# Patient Record
Sex: Female | Born: 1975 | Hispanic: Yes | Marital: Single | Smoking: Never smoker
Health system: Southern US, Community
[De-identification: ages and names within clinical notes are randomized; demographics above are authoritative.]

## PROBLEM LIST (undated history)

## (undated) DIAGNOSIS — K219 Gastro-esophageal reflux disease without esophagitis: Secondary | ICD-10-CM

## (undated) DIAGNOSIS — G4733 Obstructive sleep apnea (adult) (pediatric): Secondary | ICD-10-CM

## (undated) HISTORY — PX: ABDOMINAL SURGERY: SHX537

---

## 2019-04-21 ENCOUNTER — Emergency Department (HOSPITAL_COMMUNITY)
Admission: EM | Admit: 2019-04-21 | Discharge: 2019-04-21 | Disposition: A | Discharge status: Home / Self Care | Payer: Self-pay | Attending: Emergency Medicine | Admitting: Emergency Medicine

## 2019-04-21 ENCOUNTER — Encounter (HOSPITAL_COMMUNITY): Payer: Self-pay | Admitting: Emergency Medicine

## 2019-04-21 ENCOUNTER — Emergency Department (HOSPITAL_COMMUNITY): Payer: Self-pay

## 2019-04-21 ENCOUNTER — Other Ambulatory Visit: Payer: Self-pay

## 2019-04-21 DIAGNOSIS — R1033 Periumbilical pain: Secondary | ICD-10-CM | POA: Insufficient documentation

## 2019-04-21 LAB — CBC
HCT: 43.4 % (ref 36.0–46.0)
Hemoglobin: 14.2 g/dL (ref 12.0–15.0)
MCH: 31.1 pg (ref 26.0–34.0)
MCHC: 32.7 g/dL (ref 30.0–36.0)
MCV: 95.2 fL (ref 80.0–100.0)
Platelets: 269 10*3/uL (ref 150–400)
RBC: 4.56 MIL/uL (ref 3.87–5.11)
RDW: 12.5 % (ref 11.5–15.5)
WBC: 7.2 10*3/uL (ref 4.0–10.5)
nRBC: 0 % (ref 0.0–0.2)

## 2019-04-21 LAB — HEPATITIS PANEL, ACUTE
HCV Ab: NONREACTIVE
Hep A IgM: NONREACTIVE
Hep B C IgM: NONREACTIVE
Hepatitis B Surface Ag: NONREACTIVE

## 2019-04-21 LAB — ACETAMINOPHEN LEVEL: Acetaminophen (Tylenol), Serum: <10 ug/mL — ABNORMAL LOW (ref 10–30)

## 2019-04-21 LAB — COMPREHENSIVE METABOLIC PANEL
ALT: 201 U/L — ABNORMAL HIGH (ref 0–44)
AST: 65 U/L — ABNORMAL HIGH (ref 15–41)
Albumin: 3.7 g/dL (ref 3.5–5.0)
Alkaline Phosphatase: 253 U/L — ABNORMAL HIGH (ref 38–126)
Anion gap: 9 (ref 5–15)
BUN: 11 mg/dL (ref 6–20)
CO2: 23 mmol/L (ref 22–32)
Calcium: 9 mg/dL (ref 8.9–10.3)
Chloride: 107 mmol/L (ref 98–111)
Creatinine, Ser: 0.59 mg/dL (ref 0.44–1.00)
GFR calc Af Amer: >60 mL/min (ref >60)
GFR calc non Af Amer: >60 mL/min (ref >60)
Glucose, Bld: 94 mg/dL (ref 70–99)
Potassium: 3.9 mmol/L (ref 3.5–5.1)
Sodium: 139 mmol/L (ref 135–145)
Total Bilirubin: 0.6 mg/dL (ref 0.3–1.2)
Total Protein: 7 g/dL (ref 6.5–8.1)

## 2019-04-21 LAB — I-STAT BETA HCG BLOOD, ED (MC, WL, AP ONLY): I-stat hCG, quantitative: <5 m[IU]/mL (ref <5)

## 2019-04-21 LAB — URINALYSIS, ROUTINE W REFLEX MICROSCOPIC
Bilirubin Urine: NEGATIVE
Glucose, UA: NEGATIVE mg/dL
Hgb urine dipstick: NEGATIVE
Ketones, ur: NEGATIVE mg/dL
Leukocytes,Ua: NEGATIVE
Nitrite: NEGATIVE
Protein, ur: NEGATIVE mg/dL
Specific Gravity, Urine: 1.018 (ref 1.005–1.030)
pH: 6 (ref 5.0–8.0)

## 2019-04-21 LAB — LIPASE, BLOOD: Lipase: 25 U/L (ref 11–51)

## 2019-04-21 MED ORDER — SODIUM CHLORIDE 0.9% FLUSH
3.0000 mL | Freq: Once | INTRAVENOUS | Status: AC
  Administered 2019-04-21: 3 mL via INTRAVENOUS

## 2019-04-21 MED ORDER — ONDANSETRON HCL 4 MG/2ML IJ SOLN
4.0000 mg | Freq: Once | INTRAMUSCULAR | Status: AC
  Administered 2019-04-21: 13:00:00 4 mg via INTRAVENOUS
  Filled 2019-04-21: qty 2

## 2019-04-21 MED ORDER — MORPHINE SULFATE (PF) 4 MG/ML IV SOLN
4.0000 mg | Freq: Once | INTRAVENOUS | Status: AC
  Administered 2019-04-21: 4 mg via INTRAVENOUS
  Filled 2019-04-21: qty 1

## 2019-04-21 MED ORDER — IOHEXOL 300 MG/ML  SOLN
100.0000 mL | Freq: Once | INTRAMUSCULAR | Status: AC | PRN
  Administered 2019-04-21: 100 mL via INTRAVENOUS

## 2019-04-21 NOTE — Discharge Instructions (Addendum)
Programe una cita para ver al lo medica antes posible. Es posible que necesite una ecografa ambulatoria para evaluar ms a fondo la patologa del hgado y la vescula biliar si sus sntomas persisten. Tambin deber Chartered certified accountant seguimiento de los [place] de su panel de hepatitis.  Regrese al servicio de urgencias o busque atencin mdica si presenta nuseas incontroladas, vmitos, empeoramiento del dolor abdominal en el cuadrante superior derecho, empeoramiento del dolor periumbilical, si presenta fiebre, escalofros, disminucin del apetito o cualquier otro sntoma nuevo o que empeore.

## 2019-04-21 NOTE — ED Notes (Signed)
Patient verbalizes understanding of discharge instructions. Opportunity for questioning and answers were provided. Armband removed by staff, pt discharged from ED.  

## 2019-04-21 NOTE — ED Provider Notes (Signed)
[facility] EMERGENCY DEPARTMENT Provider Note   CSN: [id number] Arrival date & time: 04/21/19  1136     History   Chief Complaint Chief Complaint  Patient presents with  . Abdominal Pain    HPI Kathryn Sanders is a 43 y.o. female.     The history is provided by the patient. The history is limited by a language barrier. A language interpreter was used.  Abdominal Pain    42 year old Hispanic speaking female presenting for evaluation of abdominal pain.  History obtained through language interpreter.  Patient report for the past 3 to 4 days she has had persistent pain to her periumbilical region.  She described pain as a burning sensation, waxing waning, rates pain as 8 out of 10 and nonradiating.  She also noticed some purulent material oozing out from her umbilicus in which she has been using alcohol wipes to clean.  She does not complain of any associated fever chills no nausea vomiting diarrhea no pain with eating no dysuria.  She denies any recent injury.  She did report prior abdominal surgery several years ago for her large intestines.  She is unable to tell me more about her surgery.  No recent sick contact with anyone with COVID-19.  History reviewed. No pertinent past medical history.  There are no active problems to display for this patient.   The histories are not reviewed yet. Please review them in the "History" navigator section and refresh this SmartLink.   OB History   No obstetric history on file.      Home Medications    Prior to Admission medications   Not on File    Family History No family history on file.  Social History Social History   Tobacco Use  . Smoking status: Not on file  Substance Use Topics  . Alcohol use: Not on file  . Drug use: Not on file     Allergies   Patient has no allergy information on record.   Review of Systems Review of Systems  Gastrointestinal: Positive for abdominal pain.  All other  systems reviewed and are negative.    Physical Exam Updated Vital Signs BP (!) 164/61 (BP Location: Left Arm)   Pulse (!) 55   Temp 98.2 F (36.8 C) (Oral)   Resp 16   SpO2 97%   Physical Exam Vitals signs and nursing note reviewed.  Constitutional:      General: She is not in acute distress.    Appearance: She is well-developed.  HENT:     Head: Atraumatic.  Eyes:     Conjunctiva/sclera: Conjunctivae normal.  Neck:     Musculoskeletal: Neck supple.  Cardiovascular:     Rate and Rhythm: Normal rate and regular rhythm.  Pulmonary:     Effort: Pulmonary effort is normal.     Breath sounds: Normal breath sounds.  Abdominal:     General: Abdomen is flat. Bowel sounds are normal.     Palpations: Abdomen is soft.     Tenderness: There is abdominal tenderness (Tenderness to umbilical region.  Normal-appearing midline surgical abdominal scar without obvious hernia noted.  No cellulitis and no drainage from the umbilicus.) in the periumbilical area. Negative signs include Murphy's sign and McBurney's sign.  Skin:    Findings: No rash.  Neurological:     Mental Status: She is alert.      ED Treatments / Results  Labs (all labs ordered are listed, but only abnormal results are displayed)  Labs Reviewed  COMPREHENSIVE METABOLIC PANEL - Abnormal; Notable for the following components:      Result Value   AST 65 (*)    ALT 201 (*)    Alkaline Phosphatase 253 (*)    All other components within normal limits  LIPASE, BLOOD  CBC  URINALYSIS, ROUTINE W REFLEX MICROSCOPIC  ACETAMINOPHEN LEVEL  HEPATITIS PANEL, ACUTE  I-STAT BETA HCG BLOOD, ED ([facility], [facility], [facility] ONLY)    EKG None  Radiology No results found.  Procedures Procedures (including critical care time)  Medications Ordered in ED Medications  sodium chloride flush (NS) 0.9 % injection 3 mL (3 mLs Intravenous Given 04/21/19 1320)  morphine 4 MG/ML injection 4 mg (4 mg Intravenous Given 04/21/19 1316)  ondansetron  (ZOFRAN) injection 4 mg (4 mg Intravenous Given 04/21/19 1315)     Initial Impression / Assessment and Plan / ED Course  I have reviewed the triage vital signs and the nursing notes.  Pertinent labs & imaging results that were available during my care of the patient were reviewed by me and considered in my medical decision making (see chart for details).        BP 133/69 (BP Location: Right Arm)   Pulse (!) 49   Temp 98.2 F (36.8 C) (Oral)   Resp 16   SpO2 99%  Asymptomatic bradycardia.  Not on BP medication.   Final Clinical Impressions(s) / ED Diagnoses   Final diagnoses:  None    ED Discharge Orders    None     12:43 PM Patient report having periumbilical abdominal pain, report some discharge coming from her umbilicus as well.  Symptom has been ongoing for the past several days.  She has had prior abdominal surgery.  Work-up initiated.  Will obtain abdominal pelvis CT scan for evaluation.  Pain medication and antinausea medication given.  3:17 PM Pt sign out to oncoming provider who will f/u on abd/pelvis CT scan result, reassess and determine disposition.    Domenic Moras, PA-C 04/21/19 Pender, [place], DO 04/21/19 1521

## 2019-04-21 NOTE — ED Provider Notes (Signed)
Received patient as a handoff from Domenic Moras, PA-C.  Patient is Spanish-speaking and a language interpreter was used for history, physical exam, assessment, and plan.  Patient is a 43 year old female who presented to the ED with a 4-day history of burning, constant, 8 out of 10, nonradiating periumbilical pain with associated purulent umbilical drainage.  She denies any abdominal trauma, fevers, chills, nausea, vomiting, change in bowel habits, urinary symptoms, dizziness, chest pain, shortness of breath, or any other symptoms.  She had what sounds like a partial colectomy though still unclear several years ago, but denies any recent abdominal surgeries.  ROS: Reviewed and no other pertinent positives or negatives.  Physical exam:  Heart and lung sounds normal.   Adominal exam: Periumbilical TTP.  No obvious herniation.  No overlying erythema or swelling.  No obvious drainage from the umbilicus, as described.  Mildly TTP right upper quadrant, but negative Murphy sign.  No RLQ tenderness or LLQ tenderness.  Labs obtained were interpreted and are unremarkable aside from transaminitis and elevated alkaline phosphate suggested liver or gallbladder etiology.  Will obtain hepatitis panel and acetaminophen level.  PA Rona Ravens had ordered a CT abdomen and pelvis and will await results before ordering further testing.  CT abdomen showed no acute intra-abdominal findings.  Do not suspect cholecystitis at this time.  Recommend that she get established with a PCP soon as possible however and encouraged her to speak to her PCP about outpatient right upper quadrant ultrasound to evaluate her her RUQ tenderness further, particularly considering her transaminitis.   Patient voiced understanding and is agreeable to the plan.  Instructed patient to return to the ED or seek medical attention should develop any uncontrolled nausea vomiting, worsening right upper quadrant abdominal pain, worsening periumbilical pain, should  she develop any fevers, chills, diminished appetite, or any other new or worsening symptoms.   All of the evaluation and work-up results were discussed with the patient at bedside. They were provided opportunity to ask any additional questions and have none at this time. They have expressed understanding of verbal discharge instructions as well as return precautions and are agreeable to the plan.   An interpreter was used throughout the examination.         Corena Herter, PA-C 04/21/19 2015    Gareth Morgan, MD 04/23/19 1320

## 2019-04-21 NOTE — ED Triage Notes (Signed)
Pt reports having abd pain 4 years ago and now has abd pain. Pt states she has had drainage from her belly button. Deines n/v/d. 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

## 2019-08-16 ENCOUNTER — Ambulatory Visit (INDEPENDENT_AMBULATORY_CARE_PROVIDER_SITE_OTHER): Payer: Self-pay | Admitting: Primary Care

## 2019-08-27 ENCOUNTER — Other Ambulatory Visit: Payer: Self-pay

## 2019-08-27 ENCOUNTER — Ambulatory Visit (INDEPENDENT_AMBULATORY_CARE_PROVIDER_SITE_OTHER): Payer: Self-pay | Admitting: Primary Care

## 2019-08-27 ENCOUNTER — Encounter (INDEPENDENT_AMBULATORY_CARE_PROVIDER_SITE_OTHER): Payer: Self-pay | Admitting: Primary Care

## 2019-08-27 DIAGNOSIS — Z7689 Persons encountering health services in other specified circumstances: Secondary | ICD-10-CM

## 2019-08-27 DIAGNOSIS — Z85038 Personal history of other malignant neoplasm of large intestine: Secondary | ICD-10-CM

## 2019-08-27 NOTE — Progress Notes (Signed)
Virtual Visit via Telephone Note  I connected with Kathryn Sanders on 08/27/19 at  8:50 AM EST by telephone and verified that I am speaking with the correct person using two identifiers.   I discussed the limitations, risks, security and privacy concerns of performing an evaluation and management service by telephone and the availability of in person appointments. I also discussed with the patient that there may be a patient responsible charge related to this service. The patient expressed understanding and agreed to proceed.  History of Present Illness: Kathryn Sanders is having a tele visit to establish care.   No past medical history on file.  No current outpatient medications on file prior to visit.   No current facility-administered medications on file prior to visit.   Observations/Objective: Review of Systems  All other systems reviewed and are negative.  Assessment and Plan: Kathryn Sanders was seen today for new patient (initial visit).  Diagnoses and all orders for this visit:  Encounter to establish care Kathryn Passe, NP-C will be your  (PCP) she is mastered prepared . She is skilled to diagnosed and treat illness. Also able to answer health concern as well as continuing care of varied medical conditions, not limited by cause, organ system, or diagnosis.   Call to schedule care gaps Pap smear , TDAP.   Follow Up Instructions:    I discussed the assessment and treatment plan with the patient. The patient was provided an opportunity to ask questions and all were answered. The patient agreed with the plan and demonstrated an understanding of the instructions.   The patient was advised to call back or seek an in-person evaluation if the symptoms worsen or if the condition fails to improve as anticipated.  I provided 8 minutes of non-face-to-face time during this encounter.   Kathryn Sessions, NP

## 2019-08-27 NOTE — Progress Notes (Signed)
Pt states she was told she needs to do an emergency test for her liver  Would like referral for dentist and eye dr Pt had surgery 4 yrs ago in [place] on her large intestine

## 2019-09-21 ENCOUNTER — Other Ambulatory Visit: Payer: Self-pay

## 2019-09-21 ENCOUNTER — Encounter (INDEPENDENT_AMBULATORY_CARE_PROVIDER_SITE_OTHER): Payer: Self-pay | Admitting: Primary Care

## 2019-09-21 ENCOUNTER — Ambulatory Visit (INDEPENDENT_AMBULATORY_CARE_PROVIDER_SITE_OTHER): Payer: BC Managed Care – PPO | Admitting: Primary Care

## 2019-09-21 VITALS — BP 124/71 | HR 57 | Temp 97.3°F | Wt 170.6 lb

## 2019-09-21 DIAGNOSIS — K5900 Constipation, unspecified: Secondary | ICD-10-CM

## 2019-09-21 DIAGNOSIS — Z0000 Encounter for general adult medical examination without abnormal findings: Secondary | ICD-10-CM

## 2019-09-21 DIAGNOSIS — Z114 Encounter for screening for human immunodeficiency virus [HIV]: Secondary | ICD-10-CM | POA: Diagnosis not present

## 2019-09-21 DIAGNOSIS — Z23 Encounter for immunization: Secondary | ICD-10-CM

## 2019-09-21 DIAGNOSIS — J9801 Acute bronchospasm: Secondary | ICD-10-CM

## 2019-09-21 MED ORDER — ALBUTEROL SULFATE HFA 108 (90 BASE) MCG/ACT IN AERS: 2.0000 | INHALATION_SPRAY | Freq: Four times a day (QID) | RESPIRATORY_TRACT | 1 refills | Status: DC | PRN

## 2019-09-21 MED ORDER — SENNA 8.6 MG PO TABS: 1.0000 | ORAL_TABLET | Freq: Every day | ORAL | 0 refills | Status: DC | PRN

## 2019-09-21 NOTE — Progress Notes (Signed)
Established Patient Office Visit  Subjective:  Patient ID: Kathryn Sanders, female    DOB: 1975/12/27  Age: 44 y.o. MRN: [id number]  CC:  Chief Complaint  Patient presents with  . Annual Exam    HPI Kathryn Sanders is a 44 year old Hispanic patient presents to day for annual physical and blood work.  History reviewed. No pertinent past medical history. History reviewed. No pertinent family history.  Social History   Socioeconomic History  . Marital status: Single    Spouse name: Not on file  . Number of children: Not on file  . Years of education: Not on file  . Highest education level: Not on file  Occupational History  . Not on file  Tobacco Use  . Smoking status: Never Smoker  . Smokeless tobacco: Never Used  Substance and Sexual Activity  . Alcohol use: Never  . Drug use: Never  . Sexual activity: Not Currently  Other Topics Concern  . Not on file  Social History Narrative  . Not on file   Social Determinants of Health   Financial Resource Strain:   . Difficulty of Paying Living Expenses:   Food Insecurity:   . Worried About Charity fundraiser in the Last Year:   . Arboriculturist in the Last Year:   Transportation Needs:   . Film/video editor (Medical):   Marland Kitchen Lack of Transportation (Non-Medical):   Physical Activity:   . Days of Exercise per Week:   . Minutes of Exercise per Session:   Stress:   . Feeling of Stress :   Social Connections:   . Frequency of Communication with Friends and Family:   . Frequency of Social Gatherings with Friends and Family:   . Attends Religious Services:   . Active Member of Clubs or Organizations:   . Attends Archivist Meetings:   Marland Kitchen Marital Status:   Intimate Partner Violence:   . Fear of Current or Ex-Partner:   . Emotionally Abused:   Marland Kitchen Physically Abused:   . Sexually Abused:     No outpatient medications prior to visit.   No facility-administered medications prior to visit.    No Known  Allergies  ROS Review of Systems  Respiratory: Positive for shortness of breath.   Gastrointestinal: Positive for constipation.  All other systems reviewed and are negative.     Objective:    Physical Exam  Constitutional: She appears well-developed and well-nourished.  HENT:  Head: Normocephalic.  Eyes: Pupils are equal, round, and reactive to light. EOM are normal.  Cardiovascular: Normal rate and regular rhythm.  Pulmonary/Chest: Effort normal and breath sounds normal.  Abdominal: Bowel sounds are normal.  Musculoskeletal:        General: Normal range of motion.     Cervical back: Normal range of motion and neck supple.  Skin: Skin is warm and dry.  Psychiatric: She has a normal mood and affect. Her behavior is normal. Judgment and thought content normal.    BP 124/71 (BP Location: Right Arm, Patient Position: Sitting, Cuff Size: Normal)   Pulse (!) 57   Temp (!) 97.3 F (36.3 C) (Temporal)   Wt 170 lb 9.6 oz (77.4 kg)   SpO2 98%  Wt Readings from Last 3 Encounters:  09/21/19 170 lb 9.6 oz (77.4 kg)     Health Maintenance Due  Topic Date Due  . HIV Screening  Never done  . TETANUS/TDAP  Never done  . PAP  SMEAR-Modifier  Never done    There are no preventive care reminders to display for this patient.  No results found for: TSH Lab Results  Component Value Date   WBC 7.2 04/21/2019   HGB 14.2 04/21/2019   HCT 43.4 04/21/2019   MCV 95.2 04/21/2019   PLT 269 04/21/2019   Lab Results  Component Value Date   NA 139 04/21/2019   K 3.9 04/21/2019   CO2 23 04/21/2019   GLUCOSE 94 04/21/2019   BUN 11 04/21/2019   CREATININE 0.59 04/21/2019   BILITOT 0.6 04/21/2019   ALKPHOS 253 (H) 04/21/2019   AST 65 (H) 04/21/2019   ALT 201 (H) 04/21/2019   PROT 7.0 04/21/2019   ALBUMIN 3.7 04/21/2019   CALCIUM 9.0 04/21/2019   ANIONGAP 9 04/21/2019   No results found for: CHOL No results found for: HDL No results found for: LDLCALC No results found for:  TRIG No results found for: CHOLHDL No results found for: HGBA1C    Assessment & Plan:  Kathryn Sanders was seen today for annual exam.  Diagnoses and all orders for this visit:  Constipation, unspecified constipation type Discussed the following    MANAGEMENT OF CHRONIC CONSTIPATION  Drink fluids in the recommended amount everyday. Recommend amount is 8 cups of water daily. Do not replace water with Gatorade or Powerade as these should only be used when you are dehydrated.   Eat lots of high fiber foods-fruits, veggies, bran and whole grain instead of white bread  Be active everyday. Inactivity makes constipation worse.  Prescribed Senna S  Need for Tdap vaccination Tdap is recommended every 10 years for adults  -     Tdap vaccine greater than or equal to 7yo IM  Class 2 severe obesity due to excess calories with serious comorbidity in adult, unspecified BMI (Kathryn Sanders) Obesity is 42-39 indicating an excess in caloric intake or underlining conditions. This may lead to other co-morbidities - hypertension, respiratory problems and cardiovascular disease .Lifestyle modifications of diet and exercise may reduce obesity.  -     Lipid Panel  Bronchospasm, acute Shortness of breath combined with allergies  -     albuterol (VENTOLIN HFA) 108 (90 Base) MCG/ACT inhaler; Inhale 2 puffs into the lungs every 6 (six) hours as needed for wheezing or shortness of breath.  Encounter for screening for HIV -     HIV Antibody (routine testing w rflx)  Annual physical exam -     CBC with Differential -     CMP14+EGFR  Other orders -     senna (SENOKOT) 8.6 MG TABS tablet; Take 1 tablet (8.6 mg total) by mouth daily as needed for mild constipation.    Meds ordered this encounter  Medications  . albuterol (VENTOLIN HFA) 108 (90 Base) MCG/ACT inhaler    Sig: Inhale 2 puffs into the lungs every 6 (six) hours as needed for wheezing or shortness of breath.    Dispense:  8 g    Refill:  1  . senna (SENOKOT)  8.6 MG TABS tablet    Sig: Take 1 tablet (8.6 mg total) by mouth daily as needed for mild constipation.    Dispense:  120 tablet    Refill:  0    Follow-up: Return for Pap .    Kerin Perna, NP

## 2019-09-21 NOTE — Patient Instructions (Signed)
Mantenimiento de la salud en las mujeres Health Maintenance, Female Adoptar un estilo de vida saludable y recibir atencin preventiva son importantes para promover la salud y el bienestar. Consulte al mdico sobre:  El esquema adecuado para hacerse pruebas y exmenes peridicos.  Cosas que puede hacer por su cuenta para prevenir enfermedades y mantenerse sana. Qu debo saber sobre la dieta, el peso y el ejercicio? Consuma una dieta saludable   Consuma una dieta que incluya muchas verduras, frutas, productos lcteos con bajo contenido de grasa y protenas magras.  No consuma muchos alimentos ricos en grasas slidas, azcares agregados o sodio. Mantenga un peso saludable El ndice de masa muscular (IMC) se utiliza para identificar problemas de peso. Proporciona una estimacin de la grasa corporal basndose en el peso y la altura. Su mdico puede ayudarle a determinar su IMC y a lograr o mantener un peso saludable. Haga ejercicio con regularidad Haga ejercicio con regularidad. Esta es una de las prcticas ms importantes que puede hacer por su salud. La mayora de los adultos deben seguir estas pautas:  Realizar, al menos, 150minutos de actividad fsica por semana. El ejercicio debe aumentar la frecuencia cardaca y hacerlo transpirar (ejercicio de intensidad moderada).  Hacer ejercicios de fortalecimiento por lo menos dos veces por semana. Agregue esto a su plan de ejercicio de intensidad moderada.  Pasar menos tiempo sentados. Incluso la actividad fsica ligera puede ser beneficiosa. Controle sus niveles de colesterol y lpidos en la sangre Comience a realizarse anlisis de lpidos y colesterol en la sangre a los 20aos y luego reptalos cada 5aos. Hgase controlar los niveles de colesterol con mayor frecuencia si:  Sus niveles de lpidos y colesterol son altos.  Es mayor de 40aos.  Presenta un alto riesgo de padecer enfermedades cardacas. Qu debo saber sobre las pruebas de  deteccin del cncer? Segn su historia clnica y sus antecedentes familiares, es posible que deba realizarse pruebas de deteccin del cncer en diferentes edades. Esto puede incluir pruebas de deteccin de lo siguiente:  Cncer de mama.  Cncer de cuello uterino.  Cncer colorrectal.  Cncer de piel.  Cncer de pulmn. Qu debo saber sobre la enfermedad cardaca, la diabetes y la hipertensin arterial? Presin arterial y enfermedad cardaca  La hipertensin arterial causa enfermedades cardacas y aumenta el riesgo de accidente cerebrovascular. Es ms probable que esto se manifieste en las personas que tienen lecturas de presin arterial alta, tienen ascendencia africana o tienen sobrepeso.  Hgase controlar la presin arterial: ? Cada 3 a 5 aos si tiene entre 18 y 39 aos. ? Todos los aos si es mayor de 40aos. Diabetes Realcese exmenes de deteccin de la diabetes con regularidad. Este anlisis revisa el nivel de azcar en la sangre en ayunas. Hgase las pruebas de deteccin:  Cada tresaos despus de los 40aos de edad si tiene un peso normal y un bajo riesgo de padecer diabetes.  Con ms frecuencia y a partir de una edad inferior si tiene sobrepeso o un alto riesgo de padecer diabetes. Qu debo saber sobre la prevencin de infecciones? Hepatitis B Si tiene un riesgo ms alto de contraer hepatitis B, debe someterse a un examen de deteccin de este virus. Hable con el mdico para averiguar si tiene riesgo de contraer la infeccin por hepatitis B. Hepatitis C Se recomienda el anlisis a:  Todos los que nacieron entre 1945 y 1965.  Todas las personas que tengan un riesgo de haber contrado hepatitis C. Enfermedades de transmisin sexual (ETS)  Hgase las   pruebas de deteccin de ITS, incluidas la gonorrea y la clamidia, si: ? Es sexualmente activa y es menor de 24aos. ? Es mayor de 24aos, y el mdico le informa que corre riesgo de tener este tipo de  infecciones. ? La actividad sexual ha cambiado desde que le hicieron la ltima prueba de deteccin y tiene un riesgo mayor de tener clamidia o gonorrea. Pregntele al mdico si usted tiene riesgo.  Pregntele al mdico si usted tiene un alto riesgo de contraer VIH. El mdico tambin puede recomendarle un medicamento recetado para ayudar a evitar la infeccin por el VIH. Si elige tomar medicamentos para prevenir el VIH, primero debe hacerse los anlisis de deteccin del VIH. Luego debe hacerse anlisis cada 3meses mientras est tomando los medicamentos. Embarazo  Si est por dejar de menstruar (fase premenopusica) y usted puede quedar embarazada, busque asesoramiento antes de quedar embarazada.  Tome de 400 a 800microgramos (mcg) de cido flico todos los das si queda embarazada.  Pida mtodos de control de la natalidad (anticonceptivos) si desea evitar un embarazo no deseado. Osteoporosis y menopausia La osteoporosis es una enfermedad en la que los huesos pierden los minerales y la fuerza por el avance de la edad. El resultado pueden ser fracturas en los huesos. Si tiene 65aos o ms, o si est en riesgo de sufrir osteoporosis y fracturas, pregunte a su mdico si debe:  Hacerse pruebas de deteccin de prdida sea.  Tomar un suplemento de calcio o de vitamina D para reducir el riesgo de fracturas.  Recibir terapia de reemplazo hormonal (TRH) para tratar los sntomas de la menopausia. Siga estas instrucciones en su casa: Estilo de vida  No consuma ningn producto que contenga nicotina o tabaco, como cigarrillos, cigarrillos electrnicos y tabaco de mascar. Si necesita ayuda para dejar de fumar, consulte al mdico.  No consuma drogas.  No comparta agujas.  Solicite ayuda a su mdico si necesita apoyo o informacin para abandonar las drogas. Consumo de alcohol  No beba alcohol si: ? Su mdico le indica no hacerlo. ? Est embarazada, puede estar embarazada o est tratando de quedar  embarazada.  Si bebe alcohol: ? Limite la cantidad que consume de 0 a 1 medida por da. ? Limite la ingesta si est amamantando.  Est atento a la cantidad de alcohol que hay en las bebidas que toma. En [place], una medida equivale a una botella de cerveza de 12oz (355ml), un vaso de vino de 5oz (148ml) o un vaso de una bebida alcohlica de alta graduacin de 1oz (44ml). Instrucciones generales  Realcese los estudios de rutina de la salud, dentales y de la vista.  Mantngase al da con las vacunas.  Infrmele a su mdico si: ? Se siente deprimida con frecuencia. ? Alguna vez ha sido vctima de maltrato o no se siente segura en su casa. Resumen  Adoptar un estilo de vida saludable y recibir atencin preventiva son importantes para promover la salud y el bienestar.  Siga las instrucciones del mdico acerca de una dieta saludable, el ejercicio y la realizacin de pruebas o exmenes para detectar enfermedades.  Siga las instrucciones del mdico con respecto al control del colesterol y la presin arterial. Esta informacin no tiene como fin reemplazar el consejo del mdico. Asegrese de hacerle al mdico cualquier pregunta que tenga. Document Revised: 07/08/2018 Document Reviewed: 07/08/2018 Elsevier Patient Education  2020 [employer]  

## 2019-09-22 LAB — CBC WITH DIFFERENTIAL/PLATELET
Basophils Absolute: 0 10*3/uL (ref 0.0–0.2)
Basos: 1 %
EOS (ABSOLUTE): 0.3 10*3/uL (ref 0.0–0.4)
Eos: 4 %
Hematocrit: 41.2 % (ref 34.0–46.6)
Hemoglobin: 13.9 g/dL (ref 11.1–15.9)
Immature Grans (Abs): 0 10*3/uL (ref 0.0–0.1)
Immature Granulocytes: 0 %
Lymphocytes Absolute: 1.5 10*3/uL (ref 0.7–3.1)
Lymphs: 21 %
MCH: 31.7 pg (ref 26.6–33.0)
MCHC: 33.7 g/dL (ref 31.5–35.7)
MCV: 94 fL (ref 79–97)
Monocytes Absolute: 0.5 10*3/uL (ref 0.1–0.9)
Monocytes: 7 %
Neutrophils Absolute: 4.7 10*3/uL (ref 1.4–7.0)
Neutrophils: 67 %
Platelets: 240 10*3/uL (ref 150–450)
RBC: 4.38 x10E6/uL (ref 3.77–5.28)
RDW: 12.7 % (ref 11.7–15.4)
WBC: 7.1 10*3/uL (ref 3.4–10.8)

## 2019-09-22 LAB — CMP14+EGFR
ALT: 76 IU/L — ABNORMAL HIGH (ref 0–32)
AST: 53 IU/L — ABNORMAL HIGH (ref 0–40)
Albumin/Globulin Ratio: 2 (ref 1.2–2.2)
Albumin: 4.3 g/dL (ref 3.8–4.8)
Alkaline Phosphatase: 204 IU/L — ABNORMAL HIGH (ref 39–117)
BUN/Creatinine Ratio: 21 (ref 9–23)
BUN: 12 mg/dL (ref 6–24)
Bilirubin Total: 0.5 mg/dL (ref 0.0–1.2)
CO2: 22 mmol/L (ref 20–29)
Calcium: 8.9 mg/dL (ref 8.7–10.2)
Chloride: 107 mmol/L — ABNORMAL HIGH (ref 96–106)
Creatinine, Ser: 0.56 mg/dL — ABNORMAL LOW (ref 0.57–1.00)
GFR calc Af Amer: 134 mL/min/{1.73_m2} (ref >59)
GFR calc non Af Amer: 116 mL/min/{1.73_m2} (ref >59)
Globulin, Total: 2.2 g/dL (ref 1.5–4.5)
Glucose: 93 mg/dL (ref 65–99)
Potassium: 4.1 mmol/L (ref 3.5–5.2)
Sodium: 142 mmol/L (ref 134–144)
Total Protein: 6.5 g/dL (ref 6.0–8.5)

## 2019-09-22 LAB — HIV ANTIBODY (ROUTINE TESTING W REFLEX): HIV Screen 4th Generation wRfx: NONREACTIVE

## 2019-09-22 LAB — LIPID PANEL
Chol/HDL Ratio: 2.5 ratio (ref 0.0–4.4)
Cholesterol, Total: 156 mg/dL (ref 100–199)
HDL: 62 mg/dL (ref >39)
LDL Chol Calc (NIH): 79 mg/dL (ref 0–99)
Triglycerides: 80 mg/dL (ref 0–149)
VLDL Cholesterol Cal: 15 mg/dL (ref 5–40)

## 2019-10-05 ENCOUNTER — Other Ambulatory Visit (HOSPITAL_COMMUNITY)
Admission: RE | Admit: 2019-10-05 | Discharge: 2019-10-05 | Disposition: A | Discharge status: Home / Self Care | Payer: BC Managed Care – PPO | Source: Ambulatory Visit | Attending: Primary Care | Admitting: Primary Care

## 2019-10-05 ENCOUNTER — Other Ambulatory Visit: Payer: Self-pay

## 2019-10-05 ENCOUNTER — Ambulatory Visit (INDEPENDENT_AMBULATORY_CARE_PROVIDER_SITE_OTHER): Payer: BC Managed Care – PPO | Admitting: Primary Care

## 2019-10-05 ENCOUNTER — Encounter (INDEPENDENT_AMBULATORY_CARE_PROVIDER_SITE_OTHER): Payer: Self-pay | Admitting: Primary Care

## 2019-10-05 VITALS — BP 113/74 | HR 67 | Temp 97.3°F | Wt 168.8 lb

## 2019-10-05 DIAGNOSIS — Z01411 Encounter for gynecological examination (general) (routine) with abnormal findings: Secondary | ICD-10-CM

## 2019-10-05 DIAGNOSIS — M25522 Pain in left elbow: Secondary | ICD-10-CM

## 2019-10-05 DIAGNOSIS — M25521 Pain in right elbow: Secondary | ICD-10-CM

## 2019-10-05 DIAGNOSIS — Z1231 Encounter for screening mammogram for malignant neoplasm of breast: Secondary | ICD-10-CM | POA: Diagnosis not present

## 2019-10-05 DIAGNOSIS — N898 Other specified noninflammatory disorders of vagina: Secondary | ICD-10-CM | POA: Insufficient documentation

## 2019-10-05 DIAGNOSIS — Z124 Encounter for screening for malignant neoplasm of cervix: Secondary | ICD-10-CM

## 2019-10-05 MED ORDER — IBUPROFEN 600 MG PO TABS: 600.0000 mg | ORAL_TABLET | Freq: Three times a day (TID) | ORAL | 0 refills | Status: DC | PRN

## 2019-10-05 MED ORDER — POLYETHYLENE GLYCOL 3350 17 GM/SCOOP PO POWD: 17.0000 g | Freq: Two times a day (BID) | ORAL | 1 refills | Status: DC | PRN

## 2019-10-05 NOTE — Progress Notes (Signed)
Established Patient Office Visit  Subjective:  Patient ID: Kathryn Sanders, female    DOB: [birth year]  Age: 44 y.o. MRN: [id number]  CC:  Chief Complaint  Patient presents with  . Gynecologic Exam    HPI Kathryn Sanders presents for gyn visit   No past medical history on file.  No past surgical history on file.  No family history on file.  Social History   Socioeconomic History  . Marital status: Single    Spouse name: Not on file  . Number of children: Not on file  . Years of education: Not on file  . Highest education level: Not on file  Occupational History  . Not on file  Tobacco Use  . Smoking status: Never Smoker  . Smokeless tobacco: Never Used  Substance and Sexual Activity  . Alcohol use: Never  . Drug use: Never  . Sexual activity: Not Currently  Other Topics Concern  . Not on file  Social History Narrative  . Not on file   Social Determinants of Health   Financial Resource Strain:   . Difficulty of Paying Living Expenses:   Food Insecurity:   . Worried About Programme researcher, broadcasting/film/video in the Last Year:   . Barista in the Last Year:   Transportation Needs:   . Freight forwarder (Medical):   Marland Kitchen Lack of Transportation (Non-Medical):   Physical Activity:   . Days of Exercise per Week:   . Minutes of Exercise per Session:   Stress:   . Feeling of Stress :   Social Connections:   . Frequency of Communication with Friends and Family:   . Frequency of Social Gatherings with Friends and Family:   . Attends Religious Services:   . Active Member of Clubs or Organizations:   . Attends Banker Meetings:   Marland Kitchen Marital Status:   Intimate Partner Violence:   . Fear of Current or Ex-Partner:   . Emotionally Abused:   Marland Kitchen Physically Abused:   . Sexually Abused:     Outpatient Medications Prior to Visit  Medication Sig Dispense Refill  . albuterol (VENTOLIN HFA) 108 (90 Base) MCG/ACT inhaler Inhale 2 puffs into the lungs every 6 (six)  hours as needed for wheezing or shortness of breath. 8 g 1  . senna (SENOKOT) 8.6 MG TABS tablet Take 1 tablet (8.6 mg total) by mouth daily as needed for mild constipation. 120 tablet 0   No facility-administered medications prior to visit.    No Known Allergies  ROS Review of Systems  Genitourinary: Positive for vaginal discharge.      Objective:    Physical Exam CONSTITUTIONAL: Well-developed, well-nourished female in no acute distress.  HENT:  Normocephalic, atraumatic, External right and left ear normal. EYES: Conjunctivae and EOM are normal. Pupils are equal, round, and reactive to light. No scleral icterus.  NECK: Normal range of motion, supple, no masses.  Normal thyroid.  SKIN: Skin is warm and dry. No rash noted. Not diaphoretic. No erythema. No pallor. NEUROLGIC: Alert and oriented to person, place, and time. Normal reflexes, muscle tone coordination. No cranial nerve deficit noted. PSYCHIATRIC: Normal mood and affect. Normal behavior. Normal judgment and thought content. CARDIOVASCULAR: Normal heart rate noted, regular rhythm RESPIRATORY: Clear to auscultation bilaterally. Effort and breath sounds normal, no problems with respiration noted. BREASTS: Taught SBE and refer for mammogram  ABDOMEN: Soft, normal bowel sounds, no distention noted.  No tenderness, rebound or guarding.  PELVIC:  Normal appearing external genitalia; normal appearing vaginal mucosa and cervix.  No abnormal discharge noted.  Pap smear obtained.  Normal uterine size, no other palpable masses, no uterine or adnexal tenderness. MUSCULOSKELETAL: Normal range of motion. No tenderness.  No cyanosis, clubbing, or edema.  2+ distal pulses. BP 113/74 (BP Location: Right Arm, Patient Position: Sitting, Cuff Size: Normal)   Pulse 67   Temp (!) 97.3 F (36.3 C) (Temporal)   Wt 168 lb 12.8 oz (76.6 kg)   SpO2 98%  Wt Readings from Last 3 Encounters:  10/05/19 168 lb 12.8 oz (76.6 kg)  09/21/19 170 lb 9.6 oz  (77.4 kg)     Health Maintenance Due  Topic Date Due  . PAP SMEAR-Modifier  Never done    There are no preventive care reminders to display for this patient.  No results found for: TSH Lab Results  Component Value Date   WBC 7.1 09/21/2019   HGB 13.9 09/21/2019   HCT 41.2 09/21/2019   MCV 94 09/21/2019   PLT 240 09/21/2019   Lab Results  Component Value Date   NA 142 09/21/2019   K 4.1 09/21/2019   CO2 22 09/21/2019   GLUCOSE 93 09/21/2019   BUN 12 09/21/2019   CREATININE 0.56 (L) 09/21/2019   BILITOT 0.5 09/21/2019   ALKPHOS 204 (H) 09/21/2019   AST 53 (H) 09/21/2019   ALT 76 (H) 09/21/2019   PROT 6.5 09/21/2019   ALBUMIN 4.3 09/21/2019   CALCIUM 8.9 09/21/2019   ANIONGAP 9 04/21/2019   Lab Results  Component Value Date   CHOL 156 09/21/2019   Lab Results  Component Value Date   HDL 62 09/21/2019   Lab Results  Component Value Date   LDLCALC 79 09/21/2019   Lab Results  Component Value Date   TRIG 80 09/21/2019   Lab Results  Component Value Date   CHOLHDL 2.5 09/21/2019   No results found for: HGBA1C    Assessment & Plan:   Problem List Items Addressed This Visit    None      No orders of the defined types were placed in this encounter.   Follow-up: No follow-ups on file.    [name], NP  Subjective:     Kathryn Sanders is a 44 y.o. female and is here for a comprehensive physical exam. The patient reports no problems.  Social History   Socioeconomic History  . Marital status: Single    Spouse name: Not on file  . Number of children: Not on file  . Years of education: Not on file  . Highest education level: Not on file  Occupational History  . Not on file  Tobacco Use  . Smoking status: Never Smoker  . Smokeless tobacco: Never Used  Substance and Sexual Activity  . Alcohol use: Never  . Drug use: Never  . Sexual activity: Not Currently  Other Topics Concern  . Not on file  Social History Narrative  . Not on  file   Social Determinants of Health   Financial Resource Strain:   . Difficulty of Paying Living Expenses:   Food Insecurity:   . Worried About Programme researcher, broadcasting/film/video in the Last Year:   . Barista in the Last Year:   Transportation Needs:   . Freight forwarder (Medical):   Marland Kitchen Lack of Transportation (Non-Medical):   Physical Activity:   . Days of Exercise per Week:   . Minutes of Exercise per  Session:   Stress:   . Feeling of Stress :   Social Connections:   . Frequency of Communication with Friends and Family:   . Frequency of Social Gatherings with Friends and Family:   . Attends Religious Services:   . Active Member of Clubs or Organizations:   . Attends Archivist Meetings:   Marland Kitchen Marital Status:   Intimate Partner Violence:   . Fear of Current or Ex-Partner:   . Emotionally Abused:   Marland Kitchen Physically Abused:   . Sexually Abused:    Health Maintenance  Topic Date Due  . PAP SMEAR-Modifier  Never done  . INFLUENZA VACCINE  01/30/2020  . TETANUS/TDAP  09/20/2029  . HIV Screening  Completed    Review of Systems Musculoskeletal:positive for bilateral pain in elbows    Objective:    completed     Assessment:    Healthy female exam.    Plan:  Mammogram  The USPSTF recommendations screening of cervical cancer every 3 years with cervical cytology.    Return 6 weeks re-evaluate bilateral joint pain in elbow. See After Visit Summary for Counseling Recommendations

## 2019-10-05 NOTE — Patient Instructions (Signed)
MANAGEMENT OF CHRONIC CONSTIPATION  Drink fluids in the recommended amount everyday. Recommend amount is 8 cups of water daily. Do not replace water with Gatorade or Powerade as these should only be used when you are dehydrated.  Eat lots of high fiber foods-fruits, veggies, bran and whole grain instead of white bread Be active everyday. Inactivity makes constipation worse. Add psyllium daily (Metamucil) which comes in capsules now. Start very low dose and work up to recommended dose on bottle daily. Stay away from Milk of Magnesia or any magnesium containing laxative, unless you need it to clear things out rarely. It is an addictive laxative and your gut will become dependent on it. If that is not working, I would start Miralax, which you can buy in generic 17 gms daily. It's a powder and not an "addictive laxative". Take it every day and titrate the dose up or down to get the daily Bm. We will consider the use of other pharmacological treatments should the above recommendations prove to be unsuccessful.   

## 2019-10-07 LAB — CERVICOVAGINAL ANCILLARY ONLY
Bacterial Vaginitis (gardnerella): NEGATIVE
Candida Glabrata: NEGATIVE
Candida Vaginitis: NEGATIVE
Chlamydia: NEGATIVE
Comment: NEGATIVE
Comment: NEGATIVE
Comment: NEGATIVE
Comment: NEGATIVE
Comment: NEGATIVE
Comment: NORMAL
Neisseria Gonorrhea: NEGATIVE
Trichomonas: NEGATIVE

## 2019-10-11 LAB — CYTOLOGY - PAP
Adequacy: ABSENT
Comment: NEGATIVE
Diagnosis: NEGATIVE
High risk HPV: NEGATIVE

## 2019-11-16 ENCOUNTER — Telehealth (INDEPENDENT_AMBULATORY_CARE_PROVIDER_SITE_OTHER): Payer: Self-pay

## 2019-11-16 ENCOUNTER — Telehealth (INDEPENDENT_AMBULATORY_CARE_PROVIDER_SITE_OTHER): Payer: BC Managed Care – PPO | Admitting: Primary Care

## 2019-11-16 ENCOUNTER — Other Ambulatory Visit: Payer: Self-pay

## 2019-11-16 ENCOUNTER — Encounter (INDEPENDENT_AMBULATORY_CARE_PROVIDER_SITE_OTHER): Payer: Self-pay | Admitting: Primary Care

## 2019-11-16 DIAGNOSIS — M79601 Pain in right arm: Secondary | ICD-10-CM

## 2019-11-16 MED ORDER — IBUPROFEN 600 MG PO TABS: 600.0000 mg | ORAL_TABLET | Freq: Three times a day (TID) | ORAL | 0 refills | Status: DC | PRN

## 2019-11-16 NOTE — Progress Notes (Signed)
Virtual Visit via Telephone Note  I connected with Kathryn Sanders on 11/16/19 at  2:50 PM EDT by telephone and verified that I am speaking with the correct person using two identifiers.   I discussed the limitations, risks, security and privacy concerns of performing an evaluation and management service by telephone and the availability of in person appointments. I also discussed with the patient that there may be a patient responsible charge related to this service. The patient expressed understanding and agreed to proceed.  Kathryn Sanders [id number] History of Present Illness: Pain was so bad last night it woke patient up out of her sleep  When she rotates arms in circle it creates pain Pt is not lifting heavy objects due to pain Pt complains of feeling a pin prick in her breast- she does self exam and has not felt anything. Wants to know if the feeling is normal  No past medical history on file.  Current Outpatient Medications on File Prior to Visit  Medication Sig Dispense Refill  . albuterol (VENTOLIN HFA) 108 (90 Base) MCG/ACT inhaler Inhale 2 puffs into the lungs every 6 (six) hours as needed for wheezing or shortness of breath. 8 g 1  . polyethylene glycol powder (GLYCOLAX/MIRALAX) 17 GM/SCOOP powder Take 17 g by mouth 2 (two) times daily as needed. 3350 g 1   No current facility-administered medications on file prior to visit.   Observations/Objective: Review of Systems  Musculoskeletal:       Right arm pain when she woke up this morning  All other systems reviewed and are negative.   Assessment and Plan: Kathryn Sanders was seen today for pain.  Diagnoses and all orders for this visit:  Right arm pain . May alternate with heat and ice application for pain relief. May also alternate with acetaminophen and Ibuprofen 600 mg q 8 prn pain relief. Other alternatives include massage, acupuncture and water aerobics.  You must stay active and avoid a sedentary lifestyle. -     ibuprofen (ADVIL) 600 MG  tablet; Take 1 tablet (600 mg total) by mouth every 8 (eight) hours as needed.    Follow Up Instructions:    I discussed the assessment and treatment plan with the patient. The patient was provided an opportunity to ask questions and all were answered. The patient agreed with the plan and demonstrated an understanding of the instructions.   The patient was advised to call back or seek an in-person evaluation if the symptoms worsen or if the condition fails to improve as anticipated.  I provided 8 minutes of non-face-to-face time during this encounter.   [name], NP

## 2019-11-16 NOTE — Progress Notes (Signed)
Pain was so bad last night it woke patient up out of her sleep  When she rotates arms in circle it creates pain Pt is not lifting heavy objects due to pain Pt complains of feeling a pin prick in her breast- she does self exam and has not felt anything. Wants to know if the feeling is normal

## 2019-11-16 NOTE — Telephone Encounter (Signed)
I connected with  Kathryn Sanders on 11/16/19 by a video enabled telemedicine application and verified that I am speaking with the correct person using two identifiers.   I discussed the limitations of evaluation and management by telemedicine. The patient expressed understanding and agreed to proceed.  Pacific interpreter Tenna Child [phone]  Maryjean Morn, CMA

## 2019-11-19 DIAGNOSIS — Z1231 Encounter for screening mammogram for malignant neoplasm of breast: Secondary | ICD-10-CM | POA: Diagnosis not present

## 2019-11-22 ENCOUNTER — Encounter (INDEPENDENT_AMBULATORY_CARE_PROVIDER_SITE_OTHER): Payer: Self-pay | Admitting: Primary Care

## 2019-12-28 ENCOUNTER — Ambulatory Visit (INDEPENDENT_AMBULATORY_CARE_PROVIDER_SITE_OTHER): Payer: BC Managed Care – PPO | Admitting: Primary Care

## 2019-12-28 ENCOUNTER — Encounter (INDEPENDENT_AMBULATORY_CARE_PROVIDER_SITE_OTHER): Payer: Self-pay | Admitting: Primary Care

## 2019-12-28 ENCOUNTER — Other Ambulatory Visit: Payer: Self-pay

## 2019-12-28 DIAGNOSIS — R140 Abdominal distension (gaseous): Secondary | ICD-10-CM

## 2019-12-28 NOTE — Patient Instructions (Addendum)
Exploracin por tomografa computarizada (TC) CT Scan  Una exploracin por tomografa computarizada (TC) es un tipo de radiografa. La exploracin por tomografa computarizada (TC) toma imgenes del interior del cuerpo. En este procedimiento, las imgenes se toman en una gran mquina que posee una abertura (tomgrafo). Qu ocurre antes del procedimiento? Mantenerse hidratado Siga las indicaciones del mdico acerca de la hidratacin, que pueden incluir lo siguiente:  Hasta 2 horas antes del procedimiento, puede seguir bebiendo lquidos claros. Estos incluyen agua, jugo de fruta transparente, caf solo y t solo. Restricciones en las comidas y bebidas [place] comidas y las bebidas, que pueden incluir lo siguiente:  Veinticuatro horas antes del procedimiento, deje de ingerir bebidas con cafena. Por ejemplo, bebidas energizantes, t, refrescos, caf y chocolate caliente.  Ocho horas antes del procedimiento, deje de ingerir comidas o alimentos pesados. Por ejemplo, carne, alimentos fritos o alimentos grasos.  Seis horas antes del procedimiento, deje de ingerir comidas o alimentos livianos. Por ejemplo, tostadas o cereales.  Seis horas antes del procedimiento, deje de beber [place] o bebidas que [employer].  Dos horas antes del procedimiento, deje de beber lquidos transparentes. Instrucciones generales  Qutese todas las joyas.  Consulte al mdico si debe cambiar o suspender sus medicamentos habituales. Esto es importante si toma medicamentos para la diabetes o anticoagulantes. Qu ocurre durante el procedimiento?  Deber recostarse sobre una camilla con los brazos por encima de la cabeza.  Pueden colocarle un tubo (catter) intravenoso en una de las venas.  Pueden inyectarle un tinte en el tubo (catter) intravenoso. Esto puede hacer que sienta calor o que tenga un gusto metlico en la boca.  La camilla sobre la cual estar recostado se  desplazar hacia adentro del tomgrafo.  Podr ver, Pharmacist, community y hablar con la persona que [place] el equipo mientras est dentro de [place]. Siga las indicaciones de esa persona.  El equipo de tomografa computarizada se mover alrededor suyo para [employer]. No se mueva.  Cuando la mquina termine de [employer], se apagar.  La camilla se desplazar fuera de este.  Luego, le retirarn el tubo (catter) intravenoso. Este procedimiento puede variar segn el mdico y el hospital. Sander Nephew sucede despus del procedimiento?  Obtener los resultados depende de usted. Pregunte la fecha en la que los resultados estarn disponibles. Resumen  Una exploracin por tomografa computarizada (TC) es un tipo de radiografa.  La exploracin por tomografa computarizada (TC) toma imgenes del interior del cuerpo.  Siga las indicaciones de su mdico acerca de comer y beber antes del procedimiento.  Podr ver, Pharmacist, community y hablar con la persona que [place] el equipo mientras est dentro de [place]. Siga las indicaciones de esa persona. Esta informacin no tiene Marine scientist el consejo del mdico. Asegrese de hacerle al mdico cualquier pregunta que tenga. Document Revised: 03/06/2018 Document Reviewed: 12/31/2016 Elsevier Patient Education  [place].

## 2019-12-28 NOTE — Progress Notes (Signed)
Established Patient Office Visit  Subjective:  Patient ID: Kathryn Sanders, female    DOB: [birth year]  Age: 44 y.o. MRN: [id number]  CC: No chief complaint on file. 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  Has more pain in the RLQ Has nausea. Denies N/D  Pain 8/10 has not tried anything pain No past medical history on file.  No past surgical history on file.  No family history on file.  Social History   Socioeconomic History  . Marital status: Single    Spouse name: Not on file  . Number of children: Not on file  . Years of education: Not on file  . Highest education level: Not on file  Occupational History  . Not on file  Tobacco Use  . Smoking status: Never Smoker  . Smokeless tobacco: Never Used  Substance and Sexual Activity  . Alcohol use: Never  . Drug use: Never  . Sexual activity: Not Currently  Other Topics Concern  . Not on file  Social History Narrative  . Not on file   Social Determinants of Health   Financial Resource Strain:   . Difficulty of Paying Living Expenses:   Food Insecurity:   . Worried About Programme researcher, broadcasting/film/video in the Last Year:   . Barista in the Last Year:   Transportation Needs:   . Freight forwarder (Medical):   Marland Kitchen Lack of Transportation (Non-Medical):   Physical Activity:   . Days of Exercise per Week:   . Minutes of Exercise per Session:   Stress:   . Feeling of Stress :   Social Connections:   . Frequency of Communication with Friends and Family:   . Frequency of Social Gatherings with Friends and Family:   . Attends Religious Services:   . Active Member of Clubs or Organizations:   . Attends Banker Meetings:   Marland Kitchen Marital Status:   Intimate Partner Violence:   . Fear of Current or  Ex-Partner:   . Emotionally Abused:   Marland Kitchen Physically Abused:   . Sexually Abused:     Outpatient Medications Prior to Visit  Medication Sig Dispense Refill  . albuterol (VENTOLIN HFA) 108 (90 Base) MCG/ACT inhaler Inhale 2 puffs into the lungs every 6 (six) hours as needed for wheezing or shortness of breath. (Patient not taking: Reported on 12/28/2019) 8 g 1  . ibuprofen (ADVIL) 600 MG tablet Take 1 tablet (600 mg total) by mouth every 8 (eight) hours as needed. (Patient not taking: Reported on 12/28/2019) 90 tablet 0  . polyethylene glycol powder (GLYCOLAX/MIRALAX) 17 GM/SCOOP powder Take 17 g by mouth 2 (two) times daily as needed. (Patient not taking: Reported on 12/28/2019) 3350 g 1   No facility-administered medications prior to visit.    No Known Allergies  ROS Review of Systems  Gastrointestinal: Positive for abdominal distention, abdominal pain and nausea.  Genitourinary: Positive for pelvic pain.  All other systems reviewed and are negative.     Objective:    Physical Exam Vitals reviewed.  Constitutional:      General: She is in acute distress.     Appearance: She is obese.  HENT:     Head: Normocephalic.  Cardiovascular:  Rate and Rhythm: Normal rate and regular rhythm.     Pulses: Normal pulses.     Heart sounds: Normal heart sounds.  Pulmonary:     Effort: Pulmonary effort is normal.     Breath sounds: Normal breath sounds.  Abdominal:     General: Bowel sounds are normal.     Comments: Healed scar abdomen 5 inches length   Musculoskeletal:        General: Normal range of motion.     Cervical back: Normal range of motion and neck supple.  Skin:    General: Skin is warm and dry.  Neurological:     Mental Status: She is alert and oriented to person, place, and time.  Psychiatric:        Mood and Affect: Mood normal.        Behavior: Behavior normal.        Thought Content: Thought content normal.        Judgment: Judgment normal.     BP 100/66    Pulse 62   Temp 97.7 F (36.5 C)   Resp 16   Ht 4' 8.5" (1.435 m)   Wt 158 lb (71.7 kg)   LMP 12/28/2018 (LMP Unknown)   SpO2 98%   BMI 34.80 kg/m  Wt Readings from Last 3 Encounters:  12/28/19 158 lb (71.7 kg)  10/05/19 168 lb 12.8 oz (76.6 kg)  09/21/19 170 lb 9.6 oz (77.4 kg)     Health Maintenance Due  Topic Date Due  . COVID-19 Vaccine (1) Never done    There are no preventive care reminders to display for this patient.  No results found for: TSH Lab Results  Component Value Date   WBC 7.1 09/21/2019   HGB 13.9 09/21/2019   HCT 41.2 09/21/2019   MCV 94 09/21/2019   PLT 240 09/21/2019   Lab Results  Component Value Date   NA 142 09/21/2019   K 4.1 09/21/2019   CO2 22 09/21/2019   GLUCOSE 93 09/21/2019   BUN 12 09/21/2019   CREATININE 0.56 (L) 09/21/2019   BILITOT 0.5 09/21/2019   ALKPHOS 204 (H) 09/21/2019   AST 53 (H) 09/21/2019   ALT 76 (H) 09/21/2019   PROT 6.5 09/21/2019   ALBUMIN 4.3 09/21/2019   CALCIUM 8.9 09/21/2019   ANIONGAP 9 04/21/2019   Lab Results  Component Value Date   CHOL 156 09/21/2019   Lab Results  Component Value Date   HDL 62 09/21/2019   Lab Results  Component Value Date   LDLCALC 79 09/21/2019   Lab Results  Component Value Date   TRIG 80 09/21/2019   Lab Results  Component Value Date   CHOLHDL 2.5 09/21/2019   No results found for: HGBA1C    Assessment & Plan:  Diagnoses and all orders for this visit:  Abdominal distension (gaseous) Generalized pain right > left no epigastric pain. She has nausea without vomiting. Bowels move daily 2-3 times. When she eats meat and pasta abdomen pain is worst. Rule out stones or cholestasias.  -     CT Abdomen Pelvis W Contrast; Future    Follow-up: Return if symptoms worsen or fail to improve.    [name], NP

## 2019-12-28 NOTE — Progress Notes (Signed)
Bilateral flank pain x 3 weeks Had operation in [place] about 4 years ago to remove a tumor. Has more pain in the RLQ Has nausea. Denies N/D  Pain 8/10 has not tried anything pain

## 2019-12-31 ENCOUNTER — Telehealth (INDEPENDENT_AMBULATORY_CARE_PROVIDER_SITE_OTHER): Payer: Self-pay | Admitting: *Deleted

## 2019-12-31 NOTE — Telephone Encounter (Signed)
Please schedule her imaging: CT abdomen

## 2020-01-04 NOTE — Telephone Encounter (Signed)
Please schedule for a CT scan

## 2020-01-07 NOTE — Telephone Encounter (Signed)
No prior authorization required for CT. Reference number [id number]. Called patient to ask if she had a preference of time/day. She did not answer, left message asking her to return call.

## 2020-01-07 NOTE — Telephone Encounter (Signed)
Patient is aware of of CT date and time.

## 2020-01-19 ENCOUNTER — Ambulatory Visit (HOSPITAL_COMMUNITY)
Admission: RE | Admit: 2020-01-19 | Discharge: 2020-01-19 | Disposition: A | Discharge status: Home / Self Care | Payer: BC Managed Care – PPO | Source: Ambulatory Visit | Attending: Primary Care | Admitting: Primary Care

## 2020-01-19 ENCOUNTER — Other Ambulatory Visit: Payer: Self-pay

## 2020-01-19 DIAGNOSIS — R140 Abdominal distension (gaseous): Secondary | ICD-10-CM | POA: Insufficient documentation

## 2020-01-19 DIAGNOSIS — K429 Umbilical hernia without obstruction or gangrene: Secondary | ICD-10-CM | POA: Diagnosis not present

## 2020-01-19 MED ORDER — IOHEXOL 300 MG/ML  SOLN
100.0000 mL | Freq: Once | INTRAMUSCULAR | Status: AC | PRN
  Administered 2020-01-19: 100 mL via INTRAVENOUS

## 2020-01-22 ENCOUNTER — Encounter (INDEPENDENT_AMBULATORY_CARE_PROVIDER_SITE_OTHER): Payer: Self-pay | Admitting: Primary Care

## 2020-01-22 ENCOUNTER — Other Ambulatory Visit (INDEPENDENT_AMBULATORY_CARE_PROVIDER_SITE_OTHER): Payer: Self-pay | Admitting: Primary Care

## 2020-01-22 DIAGNOSIS — M79601 Pain in right arm: Secondary | ICD-10-CM

## 2020-01-22 DIAGNOSIS — K439 Ventral hernia without obstruction or gangrene: Secondary | ICD-10-CM

## 2020-02-01 MED ORDER — IBUPROFEN 600 MG PO TABS: 600.0000 mg | ORAL_TABLET | Freq: Three times a day (TID) | ORAL | 0 refills | Status: DC | PRN

## 2020-02-09 DIAGNOSIS — Z20822 Contact with and (suspected) exposure to covid-19: Secondary | ICD-10-CM | POA: Diagnosis not present

## 2020-02-14 ENCOUNTER — Encounter (HOSPITAL_BASED_OUTPATIENT_CLINIC_OR_DEPARTMENT_OTHER): Payer: Self-pay

## 2020-02-14 ENCOUNTER — Emergency Department (HOSPITAL_BASED_OUTPATIENT_CLINIC_OR_DEPARTMENT_OTHER)
Admission: EM | Admit: 2020-02-14 | Discharge: 2020-02-14 | Disposition: A | Discharge status: Home / Self Care | Payer: BC Managed Care – PPO | Attending: Emergency Medicine | Admitting: Emergency Medicine

## 2020-02-14 ENCOUNTER — Encounter (HOSPITAL_COMMUNITY): Payer: Self-pay | Admitting: Emergency Medicine

## 2020-02-14 ENCOUNTER — Ambulatory Visit (HOSPITAL_COMMUNITY)
Admission: EM | Admit: 2020-02-14 | Discharge: 2020-02-14 | Disposition: A | Discharge status: Home / Self Care | Payer: BC Managed Care – PPO | Source: Home / Self Care

## 2020-02-14 ENCOUNTER — Ambulatory Visit (INDEPENDENT_AMBULATORY_CARE_PROVIDER_SITE_OTHER): Payer: BC Managed Care – PPO

## 2020-02-14 ENCOUNTER — Other Ambulatory Visit: Payer: Self-pay

## 2020-02-14 DIAGNOSIS — J129 Viral pneumonia, unspecified: Secondary | ICD-10-CM

## 2020-02-14 DIAGNOSIS — R52 Pain, unspecified: Secondary | ICD-10-CM | POA: Diagnosis not present

## 2020-02-14 DIAGNOSIS — R5383 Other fatigue: Secondary | ICD-10-CM | POA: Insufficient documentation

## 2020-02-14 DIAGNOSIS — J209 Acute bronchitis, unspecified: Secondary | ICD-10-CM | POA: Insufficient documentation

## 2020-02-14 DIAGNOSIS — R0602 Shortness of breath: Secondary | ICD-10-CM | POA: Diagnosis not present

## 2020-02-14 DIAGNOSIS — Z7951 Long term (current) use of inhaled steroids: Secondary | ICD-10-CM | POA: Insufficient documentation

## 2020-02-14 DIAGNOSIS — J9801 Acute bronchospasm: Secondary | ICD-10-CM

## 2020-02-14 DIAGNOSIS — R05 Cough: Secondary | ICD-10-CM | POA: Insufficient documentation

## 2020-02-14 DIAGNOSIS — M791 Myalgia, unspecified site: Secondary | ICD-10-CM | POA: Insufficient documentation

## 2020-02-14 DIAGNOSIS — J1282 Pneumonia due to coronavirus disease 2019: Secondary | ICD-10-CM | POA: Insufficient documentation

## 2020-02-14 DIAGNOSIS — R519 Headache, unspecified: Secondary | ICD-10-CM | POA: Insufficient documentation

## 2020-02-14 DIAGNOSIS — R831 Abnormal level of hormones in cerebrospinal fluid: Secondary | ICD-10-CM

## 2020-02-14 DIAGNOSIS — Z20822 Contact with and (suspected) exposure to covid-19: Secondary | ICD-10-CM

## 2020-02-14 DIAGNOSIS — M79601 Pain in right arm: Secondary | ICD-10-CM

## 2020-02-14 DIAGNOSIS — Z1152 Encounter for screening for COVID-19: Secondary | ICD-10-CM

## 2020-02-14 DIAGNOSIS — B349 Viral infection, unspecified: Secondary | ICD-10-CM

## 2020-02-14 DIAGNOSIS — J189 Pneumonia, unspecified organism: Secondary | ICD-10-CM | POA: Diagnosis not present

## 2020-02-14 DIAGNOSIS — R509 Fever, unspecified: Secondary | ICD-10-CM | POA: Insufficient documentation

## 2020-02-14 DIAGNOSIS — U071 COVID-19: Secondary | ICD-10-CM | POA: Insufficient documentation

## 2020-02-14 DIAGNOSIS — I10 Essential (primary) hypertension: Secondary | ICD-10-CM | POA: Diagnosis not present

## 2020-02-14 LAB — SARS CORONAVIRUS 2 BY RT PCR (HOSPITAL ORDER, PERFORMED IN ~~LOC~~ HOSPITAL LAB): SARS Coronavirus 2: POSITIVE — AB

## 2020-02-14 LAB — FERRITIN: Ferritin: 62 ng/mL (ref 11–307)

## 2020-02-14 LAB — COMPREHENSIVE METABOLIC PANEL
ALT: 118 U/L — ABNORMAL HIGH (ref 0–44)
AST: 81 U/L — ABNORMAL HIGH (ref 15–41)
Albumin: 4.4 g/dL (ref 3.5–5.0)
Alkaline Phosphatase: 180 U/L — ABNORMAL HIGH (ref 38–126)
Anion gap: 12 (ref 5–15)
BUN: 12 mg/dL (ref 6–20)
CO2: 20 mmol/L — ABNORMAL LOW (ref 22–32)
Calcium: 9.3 mg/dL (ref 8.9–10.3)
Chloride: 105 mmol/L (ref 98–111)
Creatinine, Ser: 0.62 mg/dL (ref 0.44–1.00)
GFR calc Af Amer: >60 mL/min (ref >60)
GFR calc non Af Amer: >60 mL/min (ref >60)
Glucose, Bld: 118 mg/dL — ABNORMAL HIGH (ref 70–99)
Potassium: 3.2 mmol/L — ABNORMAL LOW (ref 3.5–5.1)
Sodium: 137 mmol/L (ref 135–145)
Total Bilirubin: 0.7 mg/dL (ref 0.3–1.2)
Total Protein: 8.1 g/dL (ref 6.5–8.1)

## 2020-02-14 LAB — C-REACTIVE PROTEIN: CRP: 0.7 mg/dL (ref <1.0)

## 2020-02-14 LAB — CBC WITH DIFFERENTIAL/PLATELET
Abs Immature Granulocytes: 0.02 10*3/uL (ref 0.00–0.07)
Basophils Absolute: 0 10*3/uL (ref 0.0–0.1)
Basophils Relative: 0 %
Eosinophils Absolute: 0 10*3/uL (ref 0.0–0.5)
Eosinophils Relative: 0 %
HCT: 42.6 % (ref 36.0–46.0)
Hemoglobin: 14.5 g/dL (ref 12.0–15.0)
Immature Granulocytes: 1 %
Lymphocytes Relative: 9 %
Lymphs Abs: 0.4 10*3/uL — ABNORMAL LOW (ref 0.7–4.0)
MCH: 31.2 pg (ref 26.0–34.0)
MCHC: 34 g/dL (ref 30.0–36.0)
MCV: 91.6 fL (ref 80.0–100.0)
Monocytes Absolute: 0.1 10*3/uL (ref 0.1–1.0)
Monocytes Relative: 2 %
Neutro Abs: 3.9 10*3/uL (ref 1.7–7.7)
Neutrophils Relative %: 88 %
Platelets: 204 10*3/uL (ref 150–400)
RBC: 4.65 MIL/uL (ref 3.87–5.11)
RDW: 12.4 % (ref 11.5–15.5)
WBC: 4.4 10*3/uL (ref 4.0–10.5)
nRBC: 0 % (ref 0.0–0.2)

## 2020-02-14 LAB — LACTIC ACID, PLASMA: Lactic Acid, Venous: 1.1 mmol/L (ref 0.5–1.9)

## 2020-02-14 LAB — FIBRINOGEN: Fibrinogen: 432 mg/dL (ref 210–475)

## 2020-02-14 LAB — LACTATE DEHYDROGENASE: LDH: 159 U/L (ref 98–192)

## 2020-02-14 LAB — TRIGLYCERIDES: Triglycerides: 48 mg/dL (ref <150)

## 2020-02-14 LAB — PROCALCITONIN: Procalcitonin: <0.1 ng/mL

## 2020-02-14 LAB — PREGNANCY, URINE: Preg Test, Ur: NEGATIVE

## 2020-02-14 LAB — D-DIMER, QUANTITATIVE: D-Dimer, Quant: 1.76 ug/mL-FEU — ABNORMAL HIGH (ref 0.00–0.50)

## 2020-02-14 LAB — SARS CORONAVIRUS 2 (TAT 6-24 HRS): SARS Coronavirus 2: POSITIVE — AB

## 2020-02-14 MED ORDER — ALBUTEROL SULFATE HFA 108 (90 BASE) MCG/ACT IN AERS: 2.0000 | INHALATION_SPRAY | RESPIRATORY_TRACT | 1 refills | Status: DC | PRN

## 2020-02-14 MED ORDER — PREDNISONE 10 MG (21) PO TBPK: ORAL_TABLET | Freq: Every day | ORAL | 0 refills | Status: DC

## 2020-02-14 MED ORDER — ALBUTEROL SULFATE HFA 108 (90 BASE) MCG/ACT IN AERS
INHALATION_SPRAY | RESPIRATORY_TRACT | Status: AC
  Filled 2020-02-14: qty 6.7

## 2020-02-14 MED ORDER — AEROCHAMBER PLUS FLO-VU LARGE MISC
Status: AC
  Filled 2020-02-14: qty 1

## 2020-02-14 MED ORDER — DEXAMETHASONE SODIUM PHOSPHATE 10 MG/ML IJ SOLN
INTRAMUSCULAR | Status: AC
  Filled 2020-02-14: qty 1

## 2020-02-14 MED ORDER — BENZONATATE 100 MG PO CAPS
100.0000 mg | ORAL_CAPSULE | Freq: Three times a day (TID) | ORAL | 0 refills | Status: DC
Start: 2020-02-14 — End: 2021-06-05

## 2020-02-14 MED ORDER — POTASSIUM CHLORIDE CRYS ER 20 MEQ PO TBCR
20.0000 meq | EXTENDED_RELEASE_TABLET | Freq: Once | ORAL | Status: AC
  Administered 2020-02-14: 20 meq via ORAL
  Filled 2020-02-14: qty 1

## 2020-02-14 MED ORDER — DEXAMETHASONE SODIUM PHOSPHATE 10 MG/ML IJ SOLN
10.0000 mg | Freq: Once | INTRAMUSCULAR | Status: AC
  Administered 2020-02-14: 10 mg via INTRAMUSCULAR

## 2020-02-14 MED ORDER — IBUPROFEN 600 MG PO TABS: 600.0000 mg | ORAL_TABLET | Freq: Three times a day (TID) | ORAL | 0 refills | Status: DC | PRN

## 2020-02-14 MED ORDER — IBUPROFEN 400 MG PO TABS
600.0000 mg | ORAL_TABLET | Freq: Once | ORAL | Status: AC
  Administered 2020-02-14: 600 mg via ORAL
  Filled 2020-02-14: qty 1

## 2020-02-14 MED ORDER — ACETAMINOPHEN 325 MG PO TABS
650.0000 mg | ORAL_TABLET | Freq: Once | ORAL | Status: AC | PRN
  Administered 2020-02-14: 650 mg via ORAL
  Filled 2020-02-14: qty 2

## 2020-02-14 MED ORDER — SODIUM CHLORIDE 0.9 % IV SOLN: Freq: Once | INTRAVENOUS | Status: AC

## 2020-02-14 NOTE — Discharge Instructions (Addendum)
Go to the ER for further evaluation and treatment.  I have sent in tessalon perles for cough  Albuterol inhaler for shortness of breath, wheezing  I have sent in a steroid taper for you to take as well. Take 6 tablets for the first two days, take 5 tablets for day three and four, take 4 tablets for days five and six, take 3 tablets for days seven and eight, take 2 tablets for day nine and ten, then take 1 tablet for days eleven and twelve.

## 2020-02-14 NOTE — ED Notes (Signed)
Per EDP Charm Barges, second lactic acid discontinued

## 2020-02-14 NOTE — ED Triage Notes (Signed)
Pt arrives with IV to left hand from UC.

## 2020-02-14 NOTE — Discharge Instructions (Addendum)
You were seen in the emergency department for worsening headache body aches shortness of breath.  You tested positive for Covid.  Currently your oxygen level is good and you do not need to be admitted to the hospital.  The infusion center should contact you tomorrow for further instructions regarding medication.  Please also contact the Covid care center at [facility] for close follow-up.  Drink plenty of fluids, Tylenol for fever and pain.  Get plenty of rest.  Return to the emergency department if any worsening or concerning symptoms

## 2020-02-14 NOTE — ED Triage Notes (Signed)
Pt presents with headache, SOB, fever. Pt states has multiple family members that are covid positive that live with her. Pt symptoms started xs 3 days ago.   Pt states she took Tylenol last night.

## 2020-02-14 NOTE — ED Triage Notes (Signed)
Pt arrives with c/o SOB, states that 3 family members are Covid positive, pt was seen at [facility] had XR that showed bilateral pneumonia pt was Covid tested, has not resulted yet.

## 2020-02-14 NOTE — ED Provider Notes (Signed)
[facility] CARE CENTER   [id number] 02/14/20 Arrival Time: 1059   CC: COVID symptoms  SUBJECTIVE: History from: patient.  Kathryn Sanders is a 44 y.o. female who presents with abrupt onset of nasal congestion, SOB, PND, fever, chills, body aches, headaches, fatigue and persistent dry cough for 3 days. Reports that there are 3 household member that currently have Covid. Has negative history of Covid. Has not completed Covid vaccines. Has not taken OTC medications for this. There are no aggravating or alleviating factors. Denies previous symptoms in the past. Denies  sinus pain, rhinorrhea, sore throat,  wheezing, chest pain, nausea, changes in bowel or bladder habits.    ROS: As per HPI.  All other pertinent ROS negative.     No past medical history on file. No past surgical history on file. No Known Allergies No current facility-administered medications on file prior to encounter.   Current Outpatient Medications on File Prior to Encounter  Medication Sig Dispense Refill  . polyethylene glycol powder (GLYCOLAX/MIRALAX) 17 GM/SCOOP powder Take 17 g by mouth 2 (two) times daily as needed. (Patient not taking: Reported on 12/28/2019) 3350 g 1   Social History   Socioeconomic History  . Marital status: Single    Spouse name: Not on file  . Number of children: Not on file  . Years of education: Not on file  . Highest education level: Not on file  Occupational History  . Not on file  Tobacco Use  . Smoking status: Never Smoker  . Smokeless tobacco: Never Used  Substance and Sexual Activity  . Alcohol use: Never  . Drug use: Never  . Sexual activity: Not Currently  Other Topics Concern  . Not on file  Social History Narrative  . Not on file   Social Determinants of Health   Financial Resource Strain:   . Difficulty of Paying Living Expenses:   Food Insecurity:   . Worried About Programme researcher, broadcasting/film/video in the Last Year:   . Barista in the Last Year:   Transportation  Needs:   . Freight forwarder (Medical):   Marland Kitchen Lack of Transportation (Non-Medical):   Physical Activity:   . Days of Exercise per Week:   . Minutes of Exercise per Session:   Stress:   . Feeling of Stress :   Social Connections:   . Frequency of Communication with Friends and Family:   . Frequency of Social Gatherings with Friends and Family:   . Attends Religious Services:   . Active Member of Clubs or Organizations:   . Attends Banker Meetings:   Marland Kitchen Marital Status:   Intimate Partner Violence:   . Fear of Current or Ex-Partner:   . Emotionally Abused:   Marland Kitchen Physically Abused:   . Sexually Abused:    Family History  Problem Relation Age of Onset  . Healthy Mother   . Healthy Father     OBJECTIVE:  Vitals:   02/14/20 1329  BP: (!) 147/84  Pulse: 66  Resp: 18  Temp: 99.4 F (37.4 C)  TempSrc: Oral  SpO2: 100%     General appearance: alert; appears fatigued, but nontoxic; speaking in full sentences and tolerating own secretions HEENT: NCAT; Ears: EACs clear, TMs pearly gray; Eyes: PERRL.  EOM grossly intact. Sinuses: nontender; Nose: nares patent without rhinorrhea, Throat: oropharynx clear, tonsils mildly erythematous or enlarged, uvula midline  Neck: supple without LAD Lungs: unlabored respirations, symmetrical air entry; cough: moderate; no respiratory distress; diffuse  wheezing throughout bilateral lung fields, lower lung fields rhoncus Heart: regular rate and rhythm.  Radial pulses 2+ symmetrical bilaterally Skin: warm and dry Psychological: alert and cooperative; normal mood and affect  LABS:  No results found for this or any previous visit (from the past 24 hour(s)).   ASSESSMENT & PLAN:  1. Pneumonia, viral   2. Bronchospasm, acute   3. Right arm pain   4. Nonintractable headache, unspecified chronicity pattern, unspecified headache type   5. Body aches   6. Acute bronchitis, unspecified organism   7. Exposure to COVID-19 virus   8.  Encounter for screening for COVID-19   9. Other fatigue   10. Viral illness     Meds ordered this encounter  Medications  . albuterol (VENTOLIN HFA) 108 (90 Base) MCG/ACT inhaler    Sig: Inhale 2 puffs into the lungs every 4 (four) hours as needed for wheezing or shortness of breath.    Dispense:  8 g    Refill:  1    Order Specific Question:   Supervising Provider    Answer:   Merrilee Jansky [id number]  . ibuprofen (ADVIL) 600 MG tablet    Sig: Take 1 tablet (600 mg total) by mouth every 8 (eight) hours as needed.    Dispense:  90 tablet    Refill:  0    Order Specific Question:   Supervising Provider    Answer:   Merrilee Jansky [id number]  . predniSONE (STERAPRED UNI-PAK 21 TAB) 10 MG (21) TBPK tablet    Sig: Take by mouth daily. Take 6 tabs by mouth daily  for 2 days, then 5 tabs for 2 days, then 4 tabs for 2 days, then 3 tabs for 2 days, 2 tabs for 2 days, then 1 tab by mouth daily for 2 days    Dispense:  42 tablet    Refill:  0    Order Specific Question:   Supervising Provider    Answer:   Merrilee Jansky [id number]  . benzonatate (TESSALON) 100 MG capsule    Sig: Take 1 capsule (100 mg total) by mouth every 8 (eight) hours.    Dispense:  21 capsule    Refill:  0    Order Specific Question:   Supervising Provider    Answer:   Merrilee Jansky [id number]  . dexamethasone (DECADRON) injection 10 mg  . 0.9 %  sodium chloride infusion    Prescribed Albuterol  Prescribed prednisone taper Prescribed ibuprofen Prescribed tessalon perles Decadron 10mg  IM in office today  CXR shows viral pneumonia If symptoms get any worse, go to the ER COVID testing ordered.  It will take between 1-2 days for test results.  Someone will contact you regarding abnormal results.    Initial assessment as above. When I went in to give the patient the results of her xray she was very pale, tachypneic at RR 30, and described having difficulty breathing.  O2 sats 100, HR 120s O2 in office  at 2 LPM decreased RR to 20 With O2, HR decreased to 88 IV and fluids started here, albuterol inhaler given here To the ER via EMS for further evaluation and treatment.         , NP 02/14/20 1455

## 2020-02-14 NOTE — ED Provider Notes (Signed)
[facility] EMERGENCY DEPARTMENT Provider Note   CSN: [id number] Arrival date & time: 02/14/20  1529     History Chief Complaint  Patient presents with  . Shortness of Breath    Kathryn Sanders is a 44 y.o. female.  Spanish-speaking, history via iPad interpreter.  44 year old female complaining of 4 days of headache body aches shortness of breath fever chills nausea cough.  Multiple sick contacts at home with Covid.  Did not get Covid vaccine.  Went to urgent care today had a chest x-ray showing multifocal pneumonia consistent with viral.  Noted to be tachypneic and referred here for further evaluation.  The history is provided by the patient. The history is limited by a language barrier. A language interpreter was used.  Shortness of Breath Severity:  Moderate Onset quality:  Gradual Timing:  Constant Progression:  Worsening Chronicity:  New Context: activity and URI   Relieved by:  Nothing Worsened by:  Activity and coughing Ineffective treatments:  None tried Associated symptoms: cough, fever and headaches   Associated symptoms: no abdominal pain, no chest pain, no hemoptysis, no rash, no sore throat and no sputum production        History reviewed. No pertinent past medical history.  There are no problems to display for this patient.   Past Surgical History:  Procedure Laterality Date  . ABDOMINAL SURGERY       OB History   No obstetric history on file.     Family History  Problem Relation Age of Onset  . Healthy Mother   . Healthy Father     Social History   Tobacco Use  . Smoking status: Never Smoker  . Smokeless tobacco: Never Used  Substance Use Topics  . Alcohol use: Never  . Drug use: Never    Home Medications Prior to Admission medications   Medication Sig Start Date End Date Taking? Authorizing Provider  albuterol (VENTOLIN HFA) 108 (90 Base) MCG/ACT inhaler Inhale 2 puffs into the lungs every 4 (four) hours as needed for  wheezing or shortness of breath. 02/14/20   Moshe Cipro, NP  benzonatate (TESSALON) 100 MG capsule Take 1 capsule (100 mg total) by mouth every 8 (eight) hours. 02/14/20   Moshe Cipro, NP  ibuprofen (ADVIL) 600 MG tablet Take 1 tablet (600 mg total) by mouth every 8 (eight) hours as needed. 02/14/20   Moshe Cipro, NP  polyethylene glycol powder (GLYCOLAX/MIRALAX) 17 GM/SCOOP powder Take 17 g by mouth 2 (two) times daily as needed. Patient not taking: Reported on 12/28/2019 10/05/19   [name], NP  predniSONE (STERAPRED UNI-PAK 21 TAB) 10 MG (21) TBPK tablet Take by mouth daily. Take 6 tabs by mouth daily  for 2 days, then 5 tabs for 2 days, then 4 tabs for 2 days, then 3 tabs for 2 days, 2 tabs for 2 days, then 1 tab by mouth daily for 2 days 02/14/20   Moshe Cipro, NP    Allergies    Patient has no known allergies.  Review of Systems   Review of Systems  Constitutional: Positive for chills, fatigue and fever.  HENT: Negative for sore throat.   Eyes: Negative for visual disturbance.  Respiratory: Positive for cough and shortness of breath. Negative for hemoptysis and sputum production.   Cardiovascular: Negative for chest pain.  Gastrointestinal: Positive for nausea. Negative for abdominal pain.  Genitourinary: Negative for dysuria.  Musculoskeletal: Positive for myalgias.  Skin: Negative for rash.  Neurological: Positive for headaches.  Physical Exam Updated Vital Signs BP 130/76 (BP Location: Right Arm)   Pulse 78   Temp (!) 100.5 F (38.1 C) (Oral)   Resp (!) 25   Ht 5' (1.524 m)   Wt 81.6 kg   SpO2 100%   BMI 35.15 kg/m   Physical Exam Vitals and nursing note reviewed.  Constitutional:      General: She is not in acute distress.    Appearance: She is well-developed.  HENT:     Head: Normocephalic and atraumatic.  Eyes:     Conjunctiva/sclera: Conjunctivae normal.  Cardiovascular:     Rate and Rhythm: Normal rate and regular  rhythm.     Heart sounds: No murmur heard.   Pulmonary:     Effort: Tachypnea and accessory muscle usage present. No respiratory distress.     Breath sounds: Normal breath sounds.  Abdominal:     Palpations: Abdomen is soft.     Tenderness: There is no abdominal tenderness. There is no guarding.  Musculoskeletal:     Cervical back: Neck supple.     Right lower leg: No tenderness. No edema.     Left lower leg: No tenderness. No edema.  Skin:    General: Skin is warm and dry.     Capillary Refill: Capillary refill takes less than 2 seconds.  Neurological:     General: No focal deficit present.     Mental Status: She is alert.     ED Results / Procedures / Treatments   Labs (all labs ordered are listed, but only abnormal results are displayed) Labs Reviewed  SARS CORONAVIRUS 2 BY RT PCR (HOSPITAL ORDER, PERFORMED IN [place] [facility]) - Abnormal; Notable for the following components:      Result Value   SARS Coronavirus 2 POSITIVE (*)    All other components within normal limits  CBC WITH DIFFERENTIAL/PLATELET - Abnormal; Notable for the following components:   Lymphs Abs 0.4 (*)    All other components within normal limits  COMPREHENSIVE METABOLIC PANEL - Abnormal; Notable for the following components:   Potassium 3.2 (*)    CO2 20 (*)    Glucose, Bld 118 (*)    AST 81 (*)    ALT 118 (*)    Alkaline Phosphatase 180 (*)    All other components within normal limits  D-DIMER, QUANTITATIVE (NOT AT [facility]) - Abnormal; Notable for the following components:   D-Dimer, Quant 1.76 (*)    All other components within normal limits  CULTURE, BLOOD (ROUTINE X 2)  CULTURE, BLOOD (ROUTINE X 2)  LACTIC ACID, PLASMA  PROCALCITONIN  LACTATE DEHYDROGENASE  FERRITIN  TRIGLYCERIDES  FIBRINOGEN  C-REACTIVE PROTEIN  PREGNANCY, URINE    EKG EKG Interpretation  Date/Time:  Monday February 14 2020 15:37:13 EDT Ventricular Rate:  80 PR Interval:  140 QRS Duration: 80 QT  Interval:  398 QTC Calculation: 459 R Axis:   -4 Text Interpretation: Normal sinus rhythm Minimal voltage criteria for LVH, may be normal variant ( R in aVL ) Cannot rule out Anterior infarct , age undetermined Abnormal ECG No old tracing to compare Confirmed by Meridee Score [phone]) on 02/14/2020 3:42:40 PM   Radiology DG Chest 2 View  Result Date: 02/14/2020 CLINICAL DATA:  Cough, shortness of breath, and fatigue for 3 days. EXAM: CHEST - 2 VIEW COMPARISON:  None. FINDINGS: The cardiac silhouette is borderline to mildly enlarged. The lungs are mildly hypoinflated. There is mild peribronchial and interstitial thickening bilaterally. No confluent  airspace opacity, overt pulmonary edema, pleural effusion, pneumothorax is identified. No acute osseous abnormality is seen. IMPRESSION: Mild peribronchial and interstitial thickening which could reflect viral/atypical infection. Electronically Signed   By: Sebastian AcheAllen  Grady M.D.   On: 02/14/2020 14:14    Procedures Procedures (including critical care time)  Medications Ordered in ED Medications  acetaminophen (TYLENOL) tablet 650 mg (650 mg Oral Given 02/14/20 1539)  potassium chloride SA (KLOR-CON) CR tablet 20 mEq (20 mEq Oral Given 02/14/20 2158)  ibuprofen (ADVIL) tablet 600 mg (600 mg Oral Given 02/14/20 2158)    ED Course  I have reviewed the triage vital signs and the nursing notes.  Pertinent labs & imaging results that were available during my care of the patient were reviewed by me and considered in my medical decision making (see chart for details).  Clinical Course as of Feb 15 1011  Mon Feb 14, 2020  1958 Patient's lab work showing normal hemoglobin normal white count.  Chemistries mildly low potassium low bicarb elevated LFTs which could be consistent with Covid.  Lactic normal.     [MB]  1959 CXR done today at outside facility-interpretation-IMPRESSION: Mild peribronchial and interstitial thickening which could  reflect viral/atypical infection.   [MB]  2016 Patient was pulse ox trended in the room and sats stayed at 95%.  Covid testing positive.  Will review with patient and put in a consult to have her seen in the infusion center.   [MB]  2137 Reviewed patient's lab work and Covid results with her via the Spanish interpreter.  Have contacted the [facility] infusion center and they will recheck the patient tomorrow.  We will give her information about [facility].  Return instructions discussed.   [MB]    Clinical Course User Index [MB] Terrilee FilesButler, Payton Prinsen C, MD   MDM Rules/Calculators/A&P                         Kathryn Sanders was evaluated in Emergency Department on 02/14/2020 for the symptoms described in the history of present illness. She was evaluated in the context of the global COVID-19 pandemic, which necessitated consideration that the patient might be at risk for infection with the SARS-CoV-2 virus that causes COVID-19. Institutional protocols and algorithms that pertain to the evaluation of patients at risk for COVID-19 are in a state of rapid change based on information released by regulatory bodies including the CDC and federal and state organizations. These policies and algorithms were followed during the patient's care in the ED.  This patient complains of fatigue cough body aches headache fever; this involves an extensive number of treatment Options and is a complaint that carries with it a high risk of complications and Morbidity. The differential includes Covid, bacterial pneumonia, viral syndrome, influenza  I ordered, reviewed and interpreted labs, which included CBC with normal white count normal hemoglobin, chemistries with mildly low potassium some elevations in her LFTs, lactate normal, Covid testing positive I ordered medication oral potassium oral ibuprofen Previous records obtained and reviewed in epic including earlier urgent care visit today and reviewed chest  x-ray done by them  After the interventions stated above, I reevaluated the patient and found the patient to be satting okay and mildly tachypneic.  I reviewed via the iPad interpreter her results and the fact that she does not require admission at this time.  I have reached out to the infusion center and they will contact the patient tomorrow.  Also recommended that the patient follow-up with the post Covid clinic.  Return instructions discussed   Final Clinical Impression(s) / ED Diagnoses Final diagnoses:  Pneumonia due to COVID-19 virus    Rx / DC Orders ED Discharge Orders    None       Terrilee Files, MD 02/15/20 1015

## 2020-02-14 NOTE — ED Notes (Signed)
Patient is being discharged from the Urgent Care and sent to the Emergency Department via wheelchair and staff assist . Per Moshe Cipro, NP, patient is in need of higher level of care due to shortness of breath. Patient is aware and verbalizes understanding of plan of care.  Vitals:   02/14/20 1329  BP: (!) 147/84  Pulse: 66  Resp: 18  Temp: 99.4 F (37.4 C)  SpO2: 100%  \

## 2020-02-14 NOTE — ED Notes (Signed)
Albuterol inhaler and spacer were gven to the pt. Verbal order provider Nile Riggs NP.

## 2020-02-14 NOTE — ED Notes (Signed)
911 was called. Dermographics verified. Pt oriented in place, person and time. Cardiac monitor requested.

## 2020-02-14 NOTE — ED Notes (Signed)
Received order to ambulate patient via pulse ox. Patient resting oxygen saturations were 93% upon entering into room with patient in bed. Ambulated patient in room with patient having slow gait and maintaining oxygen saturations of 96-98%, RR 25-28. Patient placed back in bed and back on monitor. Patient tolerated well.

## 2020-02-14 NOTE — ED Notes (Signed)
EMS here for transport

## 2020-02-15 ENCOUNTER — Telehealth: Payer: Self-pay | Admitting: Nurse Practitioner

## 2020-02-15 LAB — BLOOD CULTURE ID PANEL (REFLEXED) - BCID2

## 2020-02-15 NOTE — Telephone Encounter (Signed)
Called using spanish interpreter to Discuss with patient about Covid symptoms and the use of bamlanivimab, a monoclonal antibody infusion for those with mild to moderate Covid symptoms and at a high risk of hospitalization.     Pt needs to be qualified for this infusion at the [facility] infusion center due to co-morbid conditions and/or a member of an at-risk group.    Left message for patient to return call.

## 2020-02-16 ENCOUNTER — Emergency Department (HOSPITAL_COMMUNITY)
Admission: EM | Admit: 2020-02-16 | Discharge: 2020-02-17 | Disposition: A | Discharge status: Home / Self Care | Payer: BC Managed Care – PPO | Attending: Emergency Medicine | Admitting: Emergency Medicine

## 2020-02-16 ENCOUNTER — Telehealth (HOSPITAL_BASED_OUTPATIENT_CLINIC_OR_DEPARTMENT_OTHER): Payer: Self-pay | Admitting: Emergency Medicine

## 2020-02-16 ENCOUNTER — Other Ambulatory Visit: Payer: Self-pay

## 2020-02-16 ENCOUNTER — Other Ambulatory Visit (HOSPITAL_COMMUNITY): Payer: Self-pay | Admitting: Nurse Practitioner

## 2020-02-16 DIAGNOSIS — U071 COVID-19: Secondary | ICD-10-CM

## 2020-02-16 DIAGNOSIS — Z79899 Other long term (current) drug therapy: Secondary | ICD-10-CM | POA: Diagnosis not present

## 2020-02-16 DIAGNOSIS — R0602 Shortness of breath: Secondary | ICD-10-CM | POA: Insufficient documentation

## 2020-02-16 DIAGNOSIS — R799 Abnormal finding of blood chemistry, unspecified: Secondary | ICD-10-CM | POA: Diagnosis not present

## 2020-02-16 DIAGNOSIS — Z7951 Long term (current) use of inhaled steroids: Secondary | ICD-10-CM | POA: Insufficient documentation

## 2020-02-16 DIAGNOSIS — R509 Fever, unspecified: Secondary | ICD-10-CM | POA: Diagnosis not present

## 2020-02-16 DIAGNOSIS — R05 Cough: Secondary | ICD-10-CM | POA: Diagnosis not present

## 2020-02-16 LAB — CBC
HCT: 41.2 % (ref 36.0–46.0)
Hemoglobin: 13.2 g/dL (ref 12.0–15.0)
MCH: 30.4 pg (ref 26.0–34.0)
MCHC: 32 g/dL (ref 30.0–36.0)
MCV: 94.9 fL (ref 80.0–100.0)
Platelets: 198 10*3/uL (ref 150–400)
RBC: 4.34 MIL/uL (ref 3.87–5.11)
RDW: 12.9 % (ref 11.5–15.5)
WBC: 10.9 10*3/uL — ABNORMAL HIGH (ref 4.0–10.5)
nRBC: 0 % (ref 0.0–0.2)

## 2020-02-16 LAB — BASIC METABOLIC PANEL
Anion gap: 10 (ref 5–15)
BUN: 12 mg/dL (ref 6–20)
CO2: 21 mmol/L — ABNORMAL LOW (ref 22–32)
Calcium: 8.7 mg/dL — ABNORMAL LOW (ref 8.9–10.3)
Chloride: 110 mmol/L (ref 98–111)
Creatinine, Ser: 0.54 mg/dL (ref 0.44–1.00)
GFR calc Af Amer: >60 mL/min (ref >60)
GFR calc non Af Amer: >60 mL/min (ref >60)
Glucose, Bld: 114 mg/dL — ABNORMAL HIGH (ref 70–99)
Potassium: 3.9 mmol/L (ref 3.5–5.1)
Sodium: 141 mmol/L (ref 135–145)

## 2020-02-16 NOTE — ED Triage Notes (Signed)
Pt was called today and told her blood cultures were positive. Dx with covid two days ago. Endorses headache and shob.

## 2020-02-16 NOTE — Progress Notes (Signed)
I connected by phone with Oneal Deputy on 02/16/2020 at 6:25 PM to discuss the potential use of an new treatment for mild to moderate COVID-19 viral infection in non-hospitalized patients.  This patient is a 44 y.o. female that meets the FDA criteria for Emergency Use Authorization of casirivimab\imdevimab.  Has a (+) direct SARS-CoV-2 viral test result  Has mild or moderate COVID-19   Is ? 44 years of age and weighs ? 40 kg  Is NOT hospitalized due to COVID-19  Is NOT requiring oxygen therapy or requiring an increase in baseline oxygen flow rate due to COVID-19  Is within 10 days of symptom onset  Has at least one of the high risk factor(s) for progression to severe COVID-19 and/or hospitalization as defined in EUA.  Specific high risk criteria : BMI > 25  Sx 02/11/20. Unvaccinated.   I have spoken and communicated the following to the patient or parent/caregiver:  1. FDA has authorized the emergency use of casirivimab\imdevimab for the treatment of mild to moderate COVID-19 in adults and pediatric patients with positive results of direct SARS-CoV-2 viral testing who are 29 years of age and older weighing at least 40 kg, and who are at high risk for progressing to severe COVID-19 and/or hospitalization.  2. The significant known and potential risks and benefits of casirivimab\imdevimab, and the extent to which such potential risks and benefits are unknown.  3. Information on available alternative treatments and the risks and benefits of those alternatives, including clinical trials.  4. Patients treated with casirivimab\imdevimab should continue to self-isolate and use infection control measures (e.g., wear mask, isolate, social distance, avoid sharing personal items, clean and disinfect "high touch" surfaces, and frequent handwashing) according to CDC guidelines.   5. The patient or parent/caregiver has the option to accept or refuse casirivimab\imdevimab .  After reviewing this  information with the patient, The patient agreed to proceed with receiving casirivimab\imdevimab infusion and will be provided a copy of the Fact sheet prior to receiving the infusion.Consuello Masse, DNP, AGNP-C [phone] (Infusion Center Hotline)

## 2020-02-16 NOTE — ED Provider Notes (Signed)
Asked by tech to see patient as she wants to leave AMA Translator utilized Patient told to come to ED due to positive blood culture Discussed with Dr. Drue Second Blood cxs c.w. contaminate-gram variable rod Patient with normal bp hr and sats Reviewed chart and noted [facility] had contacted her Patient without message on phone [facility] to [facility] and they are following up Patient advised to stay in ED for full evaluation     Margarita Grizzle, MD 02/16/20 1830

## 2020-02-17 ENCOUNTER — Telehealth: Payer: Self-pay | Admitting: Nurse Practitioner

## 2020-02-17 ENCOUNTER — Emergency Department (HOSPITAL_COMMUNITY): Payer: BC Managed Care – PPO

## 2020-02-17 ENCOUNTER — Ambulatory Visit (HOSPITAL_COMMUNITY): Payer: BC Managed Care – PPO

## 2020-02-17 DIAGNOSIS — R05 Cough: Secondary | ICD-10-CM | POA: Diagnosis not present

## 2020-02-17 DIAGNOSIS — U071 COVID-19: Secondary | ICD-10-CM

## 2020-02-17 DIAGNOSIS — R0602 Shortness of breath: Secondary | ICD-10-CM | POA: Diagnosis not present

## 2020-02-17 LAB — CULTURE, BLOOD (ROUTINE X 2): Special Requests: ADEQUATE

## 2020-02-17 MED ORDER — HYDROCODONE-HOMATROPINE 5-1.5 MG/5ML PO SYRP: 5.0000 mL | ORAL_SOLUTION | Freq: Four times a day (QID) | ORAL | 0 refills | Status: DC | PRN

## 2020-02-17 MED ORDER — METHYLPREDNISOLONE SODIUM SUCC 125 MG IJ SOLR: 125.0000 mg | Freq: Once | INTRAMUSCULAR | Status: DC | PRN

## 2020-02-17 MED ORDER — ALBUTEROL SULFATE HFA 108 (90 BASE) MCG/ACT IN AERS: 2.0000 | INHALATION_SPRAY | Freq: Once | RESPIRATORY_TRACT | Status: DC | PRN

## 2020-02-17 MED ORDER — EPINEPHRINE 0.3 MG/0.3ML IJ SOAJ: 0.3000 mg | Freq: Once | INTRAMUSCULAR | Status: DC | PRN

## 2020-02-17 MED ORDER — ONDANSETRON 4 MG PO TBDP: 4.0000 mg | ORAL_TABLET | Freq: Three times a day (TID) | ORAL | 0 refills | Status: DC | PRN

## 2020-02-17 MED ORDER — SODIUM CHLORIDE 0.9 % IV SOLN: INTRAVENOUS | Status: DC | PRN

## 2020-02-17 MED ORDER — SODIUM CHLORIDE 0.9 % IV SOLN: 1200.0000 mg | Freq: Once | INTRAVENOUS | Status: DC

## 2020-02-17 MED ORDER — DIPHENHYDRAMINE HCL 50 MG/ML IJ SOLN: 50.0000 mg | Freq: Once | INTRAMUSCULAR | Status: DC | PRN

## 2020-02-17 MED ORDER — FAMOTIDINE IN NACL 20-0.9 MG/50ML-% IV SOLN: 20.0000 mg | Freq: Once | INTRAVENOUS | Status: DC | PRN

## 2020-02-17 NOTE — ED Notes (Signed)
PATIENT FOUND OUT SHE DIDN'T NEEDED TO BE HERE PATIENT NEEDED TO BE AT THE INFUSING CLINIC

## 2020-02-17 NOTE — ED Notes (Signed)
Patient verbalizes understanding of discharge instructions. Opportunity for questioning and answers were provided. Pt discharged from ED. 

## 2020-02-17 NOTE — Discharge Instructions (Addendum)
Please follow-up with the infusion/antibody clinic.  Return to ED for any worsening or concerning symptoms.

## 2020-02-17 NOTE — ED Notes (Signed)
Pt on the phone unable to obtain VS.

## 2020-02-17 NOTE — ED Notes (Signed)
Pt stated that she is going home 

## 2020-02-17 NOTE — ED Provider Notes (Signed)
[facility] EMERGENCY DEPARTMENT Provider Note   CSN: [id number] Arrival date & time: 02/16/20  1147     History Chief Complaint  Patient presents with  . Covid/ abdn. lab    Kathryn Sanders is a 44 y.o. female.  HPI   Patient presents to the ED from home for positive blood cultures.  The patient was asked to come back to the ED for evaluation.  Blood cultures 1x2 positive for bacillus/gram-negative rod.  Patient currently suffering from COVID-19 infection.  She has had fever, nausea, chills and some shortness of breath.  Patient has no other symptoms at this time.  She was scheduled for antibody fusion but instead will have that infusion done tomorrow.  No other symptoms.  Video based interpreter services were used for this interaction.     No past medical history on file.  There are no problems to display for this patient.   Past Surgical History:  Procedure Laterality Date  . ABDOMINAL SURGERY       OB History   No obstetric history on file.     Family History  Problem Relation Age of Onset  . Healthy Mother   . Healthy Father     Social History   Tobacco Use  . Smoking status: Never Smoker  . Smokeless tobacco: Never Used  Substance Use Topics  . Alcohol use: Never  . Drug use: Never    Home Medications Prior to Admission medications   Medication Sig Start Date End Date Taking? Authorizing Provider  albuterol (VENTOLIN HFA) 108 (90 Base) MCG/ACT inhaler Inhale 2 puffs into the lungs every 4 (four) hours as needed for wheezing or shortness of breath. 02/14/20  Yes Moshe Cipro, NP  benzonatate (TESSALON) 100 MG capsule Take 1 capsule (100 mg total) by mouth every 8 (eight) hours. 02/14/20  Yes Moshe Cipro, NP  ibuprofen (ADVIL) 600 MG tablet Take 1 tablet (600 mg total) by mouth every 8 (eight) hours as needed. 02/14/20  Yes Moshe Cipro, NP  predniSONE (STERAPRED UNI-PAK 21 TAB) 10 MG (21) TBPK tablet Take by mouth  daily. Take 6 tabs by mouth daily  for 2 days, then 5 tabs for 2 days, then 4 tabs for 2 days, then 3 tabs for 2 days, 2 tabs for 2 days, then 1 tab by mouth daily for 2 days 02/14/20  Yes Moshe Cipro, NP  HYDROcodone-homatropine [facility]) 5-1.5 MG/5ML syrup Take 5 mLs by mouth every 6 (six) hours as needed for cough. 02/17/20   Nino Parsley, MD  ondansetron (ZOFRAN ODT) 4 MG disintegrating tablet Take 1 tablet (4 mg total) by mouth every 8 (eight) hours as needed for nausea or vomiting. 02/17/20   Nino Parsley, MD  polyethylene glycol powder (GLYCOLAX/MIRALAX) 17 GM/SCOOP powder Take 17 g by mouth 2 (two) times daily as needed. Patient not taking: Reported on 12/28/2019 10/05/19   [name], NP    Allergies    Patient has no known allergies.  Review of Systems   Review of Systems  Constitutional: Positive for chills. Negative for fever.  HENT: Negative for ear pain and sore throat.   Eyes: Negative for pain and visual disturbance.  Respiratory: Positive for cough and shortness of breath.   Cardiovascular: Negative for chest pain and palpitations.  Gastrointestinal: Negative for abdominal pain, diarrhea, nausea and vomiting.  Genitourinary: Negative for dysuria and hematuria.  Musculoskeletal: Negative for arthralgias and back pain.  Skin: Negative for color change and rash.  Neurological:  Negative for seizures and syncope.  All other systems reviewed and are negative.   Physical Exam Updated Vital Signs BP 116/74   Pulse 60   Temp 99.1 F (37.3 C)   Resp 17   SpO2 97%   Physical Exam Vitals and nursing note reviewed.  Constitutional:      General: She is not in acute distress.    Appearance: Normal appearance. She is well-developed and normal weight. She is not ill-appearing or toxic-appearing.  HENT:     Head: Normocephalic and atraumatic.  Eyes:     Extraocular Movements: Extraocular movements intact.     Conjunctiva/sclera: Conjunctivae normal.      Pupils: Pupils are equal, round, and reactive to light.  Cardiovascular:     Rate and Rhythm: Normal rate and regular rhythm.     Heart sounds: No murmur heard.   Pulmonary:     Effort: Pulmonary effort is normal. No respiratory distress.     Breath sounds: Normal breath sounds.  Abdominal:     General: There is no distension.     Palpations: Abdomen is soft.     Tenderness: There is no abdominal tenderness.  Musculoskeletal:     Cervical back: Normal range of motion and neck supple. No rigidity.     Right lower leg: No edema.     Left lower leg: No edema.  Skin:    General: Skin is warm and dry.     Capillary Refill: Capillary refill takes less than 2 seconds.  Neurological:     General: No focal deficit present.     Mental Status: She is alert and oriented to person, place, and time. Mental status is at baseline.  Psychiatric:        Mood and Affect: Mood normal.        Behavior: Behavior normal.     ED Results / Procedures / Treatments   Labs (all labs ordered are listed, but only abnormal results are displayed) Labs Reviewed  CBC - Abnormal; Notable for the following components:      Result Value   WBC 10.9 (*)    All other components within normal limits  BASIC METABOLIC PANEL - Abnormal; Notable for the following components:   CO2 21 (*)    Glucose, Bld 114 (*)    Calcium 8.7 (*)    All other components within normal limits  CULTURE, BLOOD (ROUTINE X 2)  CULTURE, BLOOD (ROUTINE X 2)    EKG EKG Interpretation  Date/Time:  Wednesday February 16 2020 12:35:20 EDT Ventricular Rate:  49 PR Interval:  140 QRS Duration: 80 QT Interval:  464 QTC Calculation: 419 R Axis:   28 Text Interpretation: Sinus bradycardia Otherwise normal ECG No significant change since last tracing Confirmed by Gwyneth Sprout ([id number]) on 02/17/2020 10:10:53 PM   Radiology DG Chest 1 View  Result Date: 02/17/2020 CLINICAL DATA:  Shortness of breath and cough.  COVID positive EXAM:  CHEST  1 VIEW COMPARISON:  Three days ago FINDINGS: Low volume chest. Prominent markings/density at the bases, unchanged. No edema, effusion, or pneumothorax. Normal heart size. IMPRESSION: Stable low volume chest with probable mild infiltrate at the bases. Electronically Signed   By: Marnee Spring M.D.   On: 02/17/2020 07:58    Procedures Procedures (including critical care time)  Medications Ordered in ED Medications - No data to display  ED Course   Henleigh Kimori Tartaglia is a 44 y.o. female with PMHx listed that presents to the Emergency Department  complaint of Covid/ abdn. lab  ED Course: Initial exam completed.   Well-appearing and hemodynamically stable.  Nontoxic and afebrile.  Physical exam significant for age-appropriate 44 year old female with abdomen soft, nondistended, nonfocally tender, extremities well perfused, and distal pulses intact.  Initial differential includes COVID-19, secondary bacterial pneumonia, bacteremia including sepsis, and lab error/contaminant.   Triage labs reviewed.  CBC with a mild leukocytosis 10.9 otherwise no acute abnormalities.  BMP with no acute electrolyte of neurological urgent intervention.  CXR with a stable low volume chest with probable infiltrate at the bases.  Similar to prior.  Dr. Rosalia Hammers previously spoke with Infectious Disease, Dr. Drue Second, who reported likely contaminate. Given her clinical appearance without evidence of toxicity on exam today, agree.  Symptomatology likely related to COVID-19 pneumonia.  Monoclonal antibody infusion scheduled for tomorrow.  Hycodan and Zofran for symptomatic control.   Diagnostics Vital Signs: reviewed Labs: reviewed and significant findings discussed above Imaging: personally reviewed images interpreted by radiology EKG: reviewed Records: nursing notes along with previous records reviewed and pertinent data discussed   Consults:  none   Reevaluation/Disposition:  Upon reevaluation, patients symptoms  stable/improved. No active nausea/vomiting and ambulatory without assistance prior to discharge from the emergency department.    All questions answered.  Strict return precautions were discussed. Additionally we discussed establishing and/or following-up with primary care physician.  Patient and/or family was understanding and in agreement with today's assessment and plan.   Campbell Riches, MD Emergency Medicine, PGY-3   Note: Dragon medical dictation software was used in the creation of this note.   Final Clinical Impression(s) / ED Diagnoses Final diagnoses:  COVID-19    Rx / DC Orders ED Discharge Orders         Ordered    HYDROcodone-homatropine (HYCODAN) 5-1.5 MG/5ML syrup  Every 6 hours PRN        02/17/20 1515    ondansetron (ZOFRAN ODT) 4 MG disintegrating tablet  Every 8 hours PRN        02/17/20 1515           Nino Parsley, MD 02/18/20 3009    Arby Barrette, MD 03/02/20 2330

## 2020-02-17 NOTE — ED Notes (Signed)
Pt has an appointment with [facility] tomorrow.

## 2020-02-17 NOTE — Telephone Encounter (Signed)
Patient called to advise that she is in ER due to positive blood culture. I have rescheduled appointment for Friday 8/20 for MAB if she is discharged by that time. Can also consider inpatient MAB.

## 2020-02-18 ENCOUNTER — Ambulatory Visit (HOSPITAL_COMMUNITY)
Admission: RE | Admit: 2020-02-18 | Discharge: 2020-02-18 | Disposition: A | Discharge status: Home / Self Care | Payer: BC Managed Care – PPO | Source: Ambulatory Visit | Attending: Pulmonary Disease | Admitting: Pulmonary Disease

## 2020-02-18 DIAGNOSIS — U071 COVID-19: Secondary | ICD-10-CM | POA: Insufficient documentation

## 2020-02-18 MED ORDER — METHYLPREDNISOLONE SODIUM SUCC 125 MG IJ SOLR: 125.0000 mg | Freq: Once | INTRAMUSCULAR | Status: DC | PRN

## 2020-02-18 MED ORDER — FAMOTIDINE IN NACL 20-0.9 MG/50ML-% IV SOLN: 20.0000 mg | Freq: Once | INTRAVENOUS | Status: DC | PRN

## 2020-02-18 MED ORDER — SODIUM CHLORIDE 0.9 % IV SOLN
1200.0000 mg | Freq: Once | INTRAVENOUS | Status: AC
  Administered 2020-02-18: 1200 mg via INTRAVENOUS
  Filled 2020-02-18: qty 10

## 2020-02-18 MED ORDER — SODIUM CHLORIDE 0.9 % IV SOLN: INTRAVENOUS | Status: DC | PRN

## 2020-02-18 MED ORDER — EPINEPHRINE 0.3 MG/0.3ML IJ SOAJ: 0.3000 mg | Freq: Once | INTRAMUSCULAR | Status: DC | PRN

## 2020-02-18 MED ORDER — DIPHENHYDRAMINE HCL 50 MG/ML IJ SOLN: 50.0000 mg | Freq: Once | INTRAMUSCULAR | Status: DC | PRN

## 2020-02-18 MED ORDER — ALBUTEROL SULFATE HFA 108 (90 BASE) MCG/ACT IN AERS: 2.0000 | INHALATION_SPRAY | Freq: Once | RESPIRATORY_TRACT | Status: DC | PRN

## 2020-02-18 NOTE — Progress Notes (Signed)
  Diagnosis: COVID-19  Physician:  Procedure: [facility] Infusion [facility] Med: casirivimab\imdevimab infusion - Provided patient with casirivimab\imdevimab fact sheet for patients, parents and caregivers prior to infusion.  Complications: No immediate complications noted.  Discharge: Discharged home   Kathryn Sanders 02/18/2020  

## 2020-02-18 NOTE — Discharge Instructions (Signed)

## 2020-02-20 LAB — CULTURE, BLOOD (ROUTINE X 2)
Culture: NO GROWTH
Special Requests: ADEQUATE

## 2020-02-22 ENCOUNTER — Ambulatory Visit (INDEPENDENT_AMBULATORY_CARE_PROVIDER_SITE_OTHER): Payer: BC Managed Care – PPO | Admitting: Nurse Practitioner

## 2020-02-22 ENCOUNTER — Other Ambulatory Visit: Payer: Self-pay

## 2020-02-22 VITALS — BP 118/62 | HR 45 | Temp 97.3°F | Ht <= 58 in | Wt 161.0 lb

## 2020-02-22 DIAGNOSIS — U071 COVID-19: Secondary | ICD-10-CM | POA: Diagnosis not present

## 2020-02-22 DIAGNOSIS — R0602 Shortness of breath: Secondary | ICD-10-CM | POA: Diagnosis not present

## 2020-02-22 DIAGNOSIS — J1282 Pneumonia due to coronavirus disease 2019: Secondary | ICD-10-CM

## 2020-02-22 DIAGNOSIS — R05 Cough: Secondary | ICD-10-CM | POA: Diagnosis not present

## 2020-02-22 DIAGNOSIS — R059 Cough, unspecified: Secondary | ICD-10-CM | POA: Insufficient documentation

## 2020-02-22 LAB — CULTURE, BLOOD (ROUTINE X 2)
Culture: NO GROWTH
Culture: NO GROWTH

## 2020-02-22 MED ORDER — AZITHROMYCIN 250 MG PO TABS: ORAL_TABLET | ORAL | 0 refills | Status: DC

## 2020-02-22 NOTE — Patient Instructions (Addendum)
Covid Pneumonia:  Please continue Tessalon Perles and Prednisone as directed by ED  Will order azithromycin  Please start vitamin C, zinc, Vitamin D3, and quercetin 500 mg PO BID  Deep breathing exercises  Stay active  Stay well hydrated    Follow up:  Follow up in 2 weeks or sooner if needed - may need labs and repeat chest x ray

## 2020-02-22 NOTE — Progress Notes (Signed)
@Patient  ID: , female    DOB: [birth year], 44 y.o.   MRN: [id number]  Chief Complaint  Patient presents with  . Establish Care    Tested positive 8/16 recieved infusion 8/20; Sx: SOB, cough, headaches    Referring provider: 9/20, NP  44 year old female with no significant health history - diagnosed with Covid on 02/14/20.   History of current illness:  02/14/20 ED visit: Diagnosed with Covid Pneumonia - prescribed Tessalon perles, prednisone taper, and albuterol inhaler  02/18/20 ED visit: blood cultures - negative x2, prescribed hycodan and zofran  02/18/20: MAB infusion   HPI  Patient presents today for post COVID care clinic visit.  Spanish interpreter was used for visit today.  Patient was diagnosed with COVID Pneumonia on 02/14/2020 at ED visit.  She received a monoclonal antibody infusion on 02/18/2020.  Patient has been prescribed Tessalon Perles, prednisone taper, albuterol inhaler, Hycodan, and Zofran with past 2 ED visits recently.  ED did perform blood cultures x2 which both came back negative.  Patient states that she continues to feel poorly.  She complains today of ongoing shortness of breath with exertion, cough, and headaches.  Vital signs in office today are stable.  O2 sats are stable. Denies f/c/s, n/v/d, hemoptysis, PND, chest pain or edema.       No Known Allergies  Immunization History  Administered Date(s) Administered  . Tdap 09/21/2019    History reviewed. No pertinent past medical history.  Tobacco History: Social History   Tobacco Use  Smoking Status Never Smoker  Smokeless Tobacco Never Used   Counseling given: Yes   Outpatient Encounter Medications as of 02/22/2020  Medication Sig  . albuterol (VENTOLIN HFA) 108 (90 Base) MCG/ACT inhaler Inhale 2 puffs into the lungs every 4 (four) hours as needed for wheezing or shortness of breath.  . benzonatate (TESSALON) 100 MG capsule Take 1 capsule (100 mg total) by mouth  every 8 (eight) hours.  02/24/2020 HYDROcodone-homatropine (HYCODAN) 5-1.5 MG/5ML syrup Take 5 mLs by mouth every 6 (six) hours as needed for cough.  Marland Kitchen ibuprofen (ADVIL) 600 MG tablet Take 1 tablet (600 mg total) by mouth every 8 (eight) hours as needed.  . ondansetron (ZOFRAN ODT) 4 MG disintegrating tablet Take 1 tablet (4 mg total) by mouth every 8 (eight) hours as needed for nausea or vomiting.  . predniSONE (STERAPRED UNI-PAK 21 TAB) 10 MG (21) TBPK tablet Take by mouth daily. Take 6 tabs by mouth daily  for 2 days, then 5 tabs for 2 days, then 4 tabs for 2 days, then 3 tabs for 2 days, 2 tabs for 2 days, then 1 tab by mouth daily for 2 days  . azithromycin (ZITHROMAX) 250 MG tablet Take 2 tablets (500 mg) on day 1, then take 1 tablet (250 mg) on days 2-5  . polyethylene glycol powder (GLYCOLAX/MIRALAX) 17 GM/SCOOP powder Take 17 g by mouth 2 (two) times daily as needed. (Patient not taking: Reported on 12/28/2019)   No facility-administered encounter medications on file as of 02/22/2020.     Review of Systems  Review of Systems  Constitutional: Positive for fatigue.  Respiratory: Positive for cough and shortness of breath.   Cardiovascular: Negative for palpitations and leg swelling.  Neurological: Positive for headaches.       Physical Exam  BP 118/62 (BP Location: Left Arm)   Pulse (!) 45   Temp (!) 97.3 F (36.3 C)   Ht 4' 8.5" (1.435 m)  Wt 161 lb 0.1 oz (73 kg)   SpO2 98%   BMI 35.46 kg/m   Wt Readings from Last 5 Encounters:  02/22/20 161 lb 0.1 oz (73 kg)  02/14/20 180 lb (81.6 kg)  12/28/19 158 lb (71.7 kg)  10/05/19 168 lb 12.8 oz (76.6 kg)  09/21/19 170 lb 9.6 oz (77.4 kg)     Physical Exam Vitals and nursing note reviewed.  Constitutional:      General: She is not in acute distress.    Appearance: She is well-developed.  Cardiovascular:     Rate and Rhythm: Normal rate and regular rhythm.  Pulmonary:     Effort: Pulmonary effort is normal.     Comments:  Breath sounds diminished.  Musculoskeletal:     Right lower leg: No edema.     Left lower leg: No edema.  Neurological:     Mental Status: She is alert and oriented to person, place, and time.      Imaging: DG Chest 1 View  Result Date: 02/17/2020 CLINICAL DATA:  Shortness of breath and cough.  COVID positive EXAM: CHEST  1 VIEW COMPARISON:  Three days ago FINDINGS: Low volume chest. Prominent markings/density at the bases, unchanged. No edema, effusion, or pneumothorax. Normal heart size. IMPRESSION: Stable low volume chest with probable mild infiltrate at the bases. Electronically Signed   By: Marnee Spring M.D.   On: 02/17/2020 07:58   DG Chest 2 View  Result Date: 02/14/2020 CLINICAL DATA:  Cough, shortness of breath, and fatigue for 3 days. EXAM: CHEST - 2 VIEW COMPARISON:  None. FINDINGS: The cardiac silhouette is borderline to mildly enlarged. The lungs are mildly hypoinflated. There is mild peribronchial and interstitial thickening bilaterally. No confluent airspace opacity, overt pulmonary edema, pleural effusion, pneumothorax is identified. No acute osseous abnormality is seen. IMPRESSION: Mild peribronchial and interstitial thickening which could reflect viral/atypical infection. Electronically Signed   By: Sebastian Ache M.D.   On: 02/14/2020 14:14     Assessment & Plan:   Pneumonia due to COVID-19 virus  Please continue Tessalon Perles and Prednisone as directed by ED  Will order azithromycin  Please start vitamin C, zinc, Vitamin D3, and quercetin 500 mg PO BID  Deep breathing exercises  Stay active  Stay well hydrated    Follow up:  Follow up in 2 weeks or sooner if needed - may need labs and repeat chest x ray      Ivonne Andrew, NP 02/22/2020

## 2020-02-22 NOTE — Assessment & Plan Note (Addendum)
  Please continue Tessalon Perles and Prednisone as directed by ED  Will order azithromycin  Please start vitamin C, zinc, Vitamin D3, and quercetin 500 mg PO BID  Deep breathing exercises  Stay active  Stay well hydrated    Follow up:  Follow up in 2 weeks or sooner if needed - may need labs and repeat chest x ray

## 2020-03-03 DIAGNOSIS — Z20822 Contact with and (suspected) exposure to covid-19: Secondary | ICD-10-CM | POA: Diagnosis not present

## 2020-03-07 ENCOUNTER — Ambulatory Visit
Admission: RE | Admit: 2020-03-07 | Discharge: 2020-03-07 | Disposition: A | Discharge status: Home / Self Care | Payer: BC Managed Care – PPO | Source: Ambulatory Visit | Attending: Nurse Practitioner | Admitting: Nurse Practitioner

## 2020-03-07 ENCOUNTER — Other Ambulatory Visit: Payer: Self-pay

## 2020-03-07 ENCOUNTER — Ambulatory Visit (INDEPENDENT_AMBULATORY_CARE_PROVIDER_SITE_OTHER): Payer: BC Managed Care – PPO | Admitting: Nurse Practitioner

## 2020-03-07 VITALS — BP 116/76 | HR 65 | Temp 97.7°F | Ht <= 58 in | Wt 164.0 lb

## 2020-03-07 DIAGNOSIS — R0602 Shortness of breath: Secondary | ICD-10-CM

## 2020-03-07 DIAGNOSIS — R05 Cough: Secondary | ICD-10-CM

## 2020-03-07 DIAGNOSIS — U071 COVID-19: Secondary | ICD-10-CM

## 2020-03-07 DIAGNOSIS — J1282 Pneumonia due to coronavirus disease 2019: Secondary | ICD-10-CM

## 2020-03-07 DIAGNOSIS — Z8616 Personal history of COVID-19: Secondary | ICD-10-CM

## 2020-03-07 DIAGNOSIS — R918 Other nonspecific abnormal finding of lung field: Secondary | ICD-10-CM | POA: Diagnosis not present

## 2020-03-07 DIAGNOSIS — R0683 Snoring: Secondary | ICD-10-CM | POA: Diagnosis not present

## 2020-03-07 DIAGNOSIS — Z8701 Personal history of pneumonia (recurrent): Secondary | ICD-10-CM | POA: Diagnosis not present

## 2020-03-07 DIAGNOSIS — R519 Headache, unspecified: Secondary | ICD-10-CM | POA: Insufficient documentation

## 2020-03-07 DIAGNOSIS — R059 Cough, unspecified: Secondary | ICD-10-CM

## 2020-03-07 NOTE — Patient Instructions (Signed)
Pneumonia due to COVID-19 virus:   Continue vitamin C, zinc, Vitamin D3, and quercetin 500 mg PO BID  Deep breathing exercises  Stay active  Stay well hydrated  Will recheck labs today  Will recheck chest x ray   Snoring Fatigue Headache:  May start magnesium 600 mg daily  Stay well hydrated  Will refer to neurology - may need sleep study    Follow up:  Follow up in 3 weeks or sooner if needed

## 2020-03-07 NOTE — Progress Notes (Signed)
@Patient  ID: , female    DOB: [birth year], 44 y.o.   MRN: [id number]  Chief Complaint  Patient presents with  . Follow-up    2 week follow up, Still having headaches, finished round of prednisone and zpak    Referring provider: [id number], NP   44 year old female with no significant health history - diagnosed with Covid on 02/14/20.   History of current illness:  02/14/20 ED visit: Diagnosed with Covid Pneumonia - prescribed Tessalon perles, prednisone taper, and albuterol inhaler  02/18/20 ED visit: blood cultures - negative x2, prescribed hycodan and zofran  02/18/20: MAB infusion  HPI  Patient presents today for post COVID care clinic visit.  Spanish interpreter was used for visit today.  Patient was diagnosed with COVID Pneumonia on 02/14/2020 at ED visit.  She received a monoclonal antibody infusion on 02/18/2020.  Patient has been prescribed Tessalon Perles, prednisone taper, albuterol inhaler, Hycodan, and Zofran with past 2 ED visits recently.  ED did perform blood cultures x2 which both came back negative.  Patient states that she is slowly improving.  She does still complain of some shortness of breath with exertion, headache, and body aches.  Patient has not been using her albuterol inhaler.  She does need repeat chest x-ray and repeat labs today.  Patient states that she does snore heavily at night.  We discussed that she will need to see neurology possibly get a sleep study to rule out sleep apnea.  If she does have sleep apnea this could be causing headache and fatigue during the day.  I am also concerned that her O2 level may be dropping at night. Denies f/c/s, n/v/d, hemoptysis, PND, chest pain or edema.       No Known Allergies  Immunization History  Administered Date(s) Administered  . Tdap 09/21/2019    History reviewed. No pertinent past medical history.  Tobacco History: Social History   Tobacco Use  Smoking Status Never Smoker   Smokeless Tobacco Never Used   Counseling given: Not Answered   Outpatient Encounter Medications as of 03/07/2020  Medication Sig  . albuterol (VENTOLIN HFA) 108 (90 Base) MCG/ACT inhaler Inhale 2 puffs into the lungs every 4 (four) hours as needed for wheezing or shortness of breath. (Patient not taking: Reported on 03/07/2020)  . benzonatate (TESSALON) 100 MG capsule Take 1 capsule (100 mg total) by mouth every 8 (eight) hours. (Patient not taking: Reported on 03/07/2020)  . HYDROcodone-homatropine (HYCODAN) 5-1.5 MG/5ML syrup Take 5 mLs by mouth every 6 (six) hours as needed for cough. (Patient not taking: Reported on 03/07/2020)  . ibuprofen (ADVIL) 600 MG tablet Take 1 tablet (600 mg total) by mouth every 8 (eight) hours as needed. (Patient not taking: Reported on 03/07/2020)  . ondansetron (ZOFRAN ODT) 4 MG disintegrating tablet Take 1 tablet (4 mg total) by mouth every 8 (eight) hours as needed for nausea or vomiting. (Patient not taking: Reported on 03/07/2020)  . polyethylene glycol powder (GLYCOLAX/MIRALAX) 17 GM/SCOOP powder Take 17 g by mouth 2 (two) times daily as needed. (Patient not taking: Reported on 12/28/2019)  . [DISCONTINUED] azithromycin (ZITHROMAX) 250 MG tablet Take 2 tablets (500 mg) on day 1, then take 1 tablet (250 mg) on days 2-5  . [DISCONTINUED] predniSONE (STERAPRED UNI-PAK 21 TAB) 10 MG (21) TBPK tablet Take by mouth daily. Take 6 tabs by mouth daily  for 2 days, then 5 tabs for 2 days, then 4 tabs for 2 days, then  3 tabs for 2 days, 2 tabs for 2 days, then 1 tab by mouth daily for 2 days   No facility-administered encounter medications on file as of 03/07/2020.     Review of Systems  Review of Systems  Constitutional: Positive for fatigue. Negative for fever.  HENT: Negative.   Respiratory: Positive for shortness of breath. Negative for cough.   Cardiovascular: Negative.  Negative for chest pain, palpitations and leg swelling.  Gastrointestinal: Negative.    Musculoskeletal: Positive for arthralgias and myalgias.  Allergic/Immunologic: Negative.   Neurological: Negative.   Psychiatric/Behavioral: Negative.        Physical Exam  BP 116/76 (BP Location: Left Arm)   Pulse 65   Temp 97.7 F (36.5 C)   Ht 4' 8.5" (1.435 m)   Wt 164 lb 0.1 oz (74.4 kg)   SpO2 94%   BMI 36.12 kg/m   Wt Readings from Last 5 Encounters:  03/07/20 164 lb 0.1 oz (74.4 kg)  02/22/20 161 lb 0.1 oz (73 kg)  02/14/20 180 lb (81.6 kg)  12/28/19 158 lb (71.7 kg)  10/05/19 168 lb 12.8 oz (76.6 kg)     Physical Exam Vitals and nursing note reviewed.  Constitutional:      General: She is not in acute distress.    Appearance: She is well-developed.  Cardiovascular:     Rate and Rhythm: Normal rate and regular rhythm.  Pulmonary:     Effort: Pulmonary effort is normal.     Breath sounds: Normal breath sounds.  Musculoskeletal:     Right lower leg: No edema.     Left lower leg: No edema.  Neurological:     Mental Status: She is alert and oriented to person, place, and time.  Psychiatric:        Mood and Affect: Mood normal.        Behavior: Behavior normal.     Imaging: DG Chest 1 View  Result Date: 02/17/2020 CLINICAL DATA:  Shortness of breath and cough.  COVID positive EXAM: CHEST  1 VIEW COMPARISON:  Three days ago FINDINGS: Low volume chest. Prominent markings/density at the bases, unchanged. No edema, effusion, or pneumothorax. Normal heart size. IMPRESSION: Stable low volume chest with probable mild infiltrate at the bases. Electronically Signed   By: Marnee Spring M.D.   On: 02/17/2020 07:58   DG Chest 2 View  Result Date: 02/14/2020 CLINICAL DATA:  Cough, shortness of breath, and fatigue for 3 days. EXAM: CHEST - 2 VIEW COMPARISON:  None. FINDINGS: The cardiac silhouette is borderline to mildly enlarged. The lungs are mildly hypoinflated. There is mild peribronchial and interstitial thickening bilaterally. No confluent airspace opacity,  overt pulmonary edema, pleural effusion, pneumothorax is identified. No acute osseous abnormality is seen. IMPRESSION: Mild peribronchial and interstitial thickening which could reflect viral/atypical infection. Electronically Signed   By: Sebastian Ache M.D.   On: 02/14/2020 14:14     Assessment & Plan:   History of COVID-19 Pneumonia due to COVID-19 virus:   Continue vitamin C, zinc, Vitamin D3, and quercetin 500 mg PO BID  Deep breathing exercises  Stay active  Stay well hydrated  Will recheck labs today  Will recheck chest x ray   Snoring Fatigue Headache:  May start magnesium 600 mg daily  Stay well hydrated  Will refer to neurology - may need sleep study    Follow up:  Follow up in 3 weeks or sooner if needed       Ivonne Andrew,  NP 03/07/2020

## 2020-03-07 NOTE — Assessment & Plan Note (Signed)
Pneumonia due to COVID-19 virus:   Continue vitamin C, zinc, Vitamin D3, and quercetin 500 mg PO BID  Deep breathing exercises  Stay active  Stay well hydrated  Will recheck labs today  Will recheck chest x ray   Snoring Fatigue Headache:  May start magnesium 600 mg daily  Stay well hydrated  Will refer to neurology - may need sleep study    Follow up:  Follow up in 3 weeks or sooner if needed  

## 2020-03-08 ENCOUNTER — Other Ambulatory Visit: Payer: Self-pay | Admitting: Nurse Practitioner

## 2020-03-08 ENCOUNTER — Telehealth: Payer: Self-pay | Admitting: Nurse Practitioner

## 2020-03-08 DIAGNOSIS — J1282 Pneumonia due to coronavirus disease 2019: Secondary | ICD-10-CM

## 2020-03-08 LAB — COMPREHENSIVE METABOLIC PANEL
ALT: 50 IU/L — ABNORMAL HIGH (ref 0–32)
AST: 24 IU/L (ref 0–40)
Albumin/Globulin Ratio: 1.4 (ref 1.2–2.2)
Albumin: 3.9 g/dL (ref 3.8–4.8)
Alkaline Phosphatase: 197 IU/L — ABNORMAL HIGH (ref 48–121)
BUN/Creatinine Ratio: 18 (ref 9–23)
BUN: 13 mg/dL (ref 6–24)
Bilirubin Total: 0.3 mg/dL (ref 0.0–1.2)
CO2: 26 mmol/L (ref 20–29)
Calcium: 8.9 mg/dL (ref 8.7–10.2)
Chloride: 105 mmol/L (ref 96–106)
Creatinine, Ser: 0.72 mg/dL (ref 0.57–1.00)
GFR calc Af Amer: 119 mL/min/{1.73_m2} (ref >59)
GFR calc non Af Amer: 104 mL/min/{1.73_m2} (ref >59)
Globulin, Total: 2.7 g/dL (ref 1.5–4.5)
Glucose: 101 mg/dL — ABNORMAL HIGH (ref 65–99)
Potassium: 4.2 mmol/L (ref 3.5–5.2)
Sodium: 143 mmol/L (ref 134–144)
Total Protein: 6.6 g/dL (ref 6.0–8.5)

## 2020-03-08 LAB — CBC
Hematocrit: 40.2 % (ref 34.0–46.6)
Hemoglobin: 13.1 g/dL (ref 11.1–15.9)
MCH: 30.7 pg (ref 26.6–33.0)
MCHC: 32.6 g/dL (ref 31.5–35.7)
MCV: 94 fL (ref 79–97)
Platelets: 298 10*3/uL (ref 150–450)
RBC: 4.27 x10E6/uL (ref 3.77–5.28)
RDW: 12.9 % (ref 11.7–15.4)
WBC: 4.7 10*3/uL (ref 3.4–10.8)

## 2020-03-08 MED ORDER — PREDNISONE 10 MG PO TABS: ORAL_TABLET | ORAL | 0 refills | Status: DC

## 2020-03-08 NOTE — Telephone Encounter (Signed)
-----   Message from Ivonne Andrew, NP sent at 03/08/2020  8:01 AM EDT ----- Please call to let patient know that her labs are back and her liver function is much improved. Her chest x ray looked worse. I am going to order her a round of prednisone. Please tell her to use her inhaler twice daily and do deep breathing exercises. I am going to place a referral to pulmonary.

## 2020-03-08 NOTE — Telephone Encounter (Signed)
  Results given to patient. Verbally understood. No additional questions.

## 2020-03-09 ENCOUNTER — Ambulatory Visit: Payer: Self-pay | Admitting: General Surgery

## 2020-03-09 DIAGNOSIS — K432 Incisional hernia without obstruction or gangrene: Secondary | ICD-10-CM | POA: Diagnosis not present

## 2020-03-09 NOTE — H&P (Signed)
History of Present Illness Kathryn Filler MD; 03/09/2020 10:41 AM) The patient is a 44 year old female who presents with an incisional hernia. Chief Complaint: Umbilical hernia, abdominal pain  Patient is a 44 year old female, who comes in secondary to an umbilical hernia and pain. 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 Patient states that she does have some pain in her umbilicus. She states that she has pain with lifting. 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 She states that she does have pain that fairly severe approximately every third day.  Patient had no signs or symptoms of incarceration or granulation.  Patient did undergo CT scan in July 21 this did reveal a tiny incisional hernia just above the umbilicus.     Past Surgical History (Tanisha A. Manson Passey, RMA; 03/09/2020 10:30 AM) No pertinent past surgical history   Diagnostic Studies History (Tanisha A. Manson Passey, RMA; 03/09/2020 10:30 AM) Colonoscopy  never Mammogram  within last year  Allergies (Tanisha A. Manson Passey, RMA; 03/09/2020 10:30 AM) No Known Drug Allergies  [03/09/2020]: Allergies Reconciled   Medication History (Tanisha A. Manson Passey, RMA; 03/09/2020 10:30 AM) Ibuprofen (600MG  Tablet, Oral) Active. Ventolin HFA (108 (90 Base)MCG/ACT Aerosol Soln, Inhalation) Active. Medications Reconciled  Social History (Tanisha A. , RMA; 03/09/2020 10:30 AM) No alcohol use  No caffeine use  No drug use  Tobacco use  Never smoker.  Family History (Tanisha A. 05/09/2020, RMA; 03/09/2020 10:30 AM) First Degree Relatives  No pertinent family history   Pregnancy / Birth History (Tanisha A. 05/09/2020, RMA; 03/09/2020 10:30 AM) 05/09/2020  5 Irregular periods  Maternal age  92-20 Para  5  Other Problems (Tanisha A. 19-38, RMA; 03/09/2020 10:30 AM) Umbilical Hernia Repair     Review of Systems 05/09/2020 MD; 03/09/2020 10:40  AM) General Not Present- Appetite Loss, Chills, Fatigue, Fever, Night Sweats, Weight Gain and Weight Loss. Skin Not Present- Change in Wart/Mole, Dryness, Hives, Jaundice, New Lesions, Non-Healing Wounds, Rash and Ulcer. HEENT Not Present- Earache, Hearing Loss, Hoarseness, Nose Bleed, Oral Ulcers, Ringing in the Ears, Seasonal Allergies, Sinus Pain, Sore Throat, Visual Disturbances, Wears glasses/contact lenses and Yellow Eyes. Respiratory Not Present- Bloody sputum, Chronic Cough, Difficulty Breathing, Snoring and Wheezing. Breast Not Present- Breast Mass, Breast Pain, Nipple Discharge and Skin Changes. Cardiovascular Not Present- Chest Pain, Difficulty Breathing Lying Down, Leg Cramps, Palpitations, Rapid Heart Rate, Shortness of Breath and Swelling of Extremities. Gastrointestinal Present- Indigestion. Not Present- Abdominal Pain, Bloating, Bloody Stool, Change in Bowel Habits, Chronic diarrhea, Constipation, Difficulty Swallowing, Excessive gas, Gets full quickly at meals, Hemorrhoids, Nausea, Rectal Pain and Vomiting. Female Genitourinary Not Present- Frequency, Nocturia, Painful Urination, Pelvic Pain and Urgency. Musculoskeletal Not Present- Back Pain, Joint Pain, Joint Stiffness, Muscle Pain, Muscle Weakness and Swelling of Extremities. Neurological Not Present- Decreased Memory, Fainting, Headaches, Numbness, Seizures, Tingling, Tremor, Trouble walking and Weakness. Psychiatric Not Present- Anxiety, Bipolar, Change in Sleep Pattern, Depression, Fearful and Frequent crying. Endocrine Not Present- Cold Intolerance, Excessive Hunger, Hair Changes, Heat Intolerance, Hot flashes and New Diabetes. Hematology Not Present- Blood Thinners, Easy Bruising, Excessive bleeding, Gland problems, HIV and Persistent Infections. All other systems negative  Vitals (Tanisha A. Brown RMA; 03/09/2020 10:31 AM) 03/09/2020 10:31 AM Weight: 164.2 lb Height: 57in Body Surface Area: 1.65 m Body Mass Index:  35.53 kg/m  Temp.: 98.74F  Pulse: 74 (Regular)  BP: 116/74(Sitting, Left Arm, Standard)       Physical Exam (  Kathryn Filler MD; 03/09/2020 10:43 AM) The physical exam findings are as follows: Note: Constitutional: No acute distress, conversant, appears stated age  Eyes: Anicteric sclerae, moist conjunctiva, no lid lag  Neck: No thyromegaly, trachea midline, no cervical lymphadenopathy  Lungs: Clear to auscultation biilaterally, normal respiratory effot  Cardiovascular: regular rate & rhythm, no murmurs, no peripheal edema, pedal pulses 2+  GI: Soft, no masses or hepatosplenomegaly, tender to palpation in the midline above the umbilicus.  MSK: Normal gait, no clubbing cyanosis, edema  Skin: No rashes, palpation reveals normal skin turgor  Psychiatric: Appropriate judgment and insight, oriented to person, place, and time  Abdomen Inspection Hernias - Incisional - Reducible (Just above the umbilicus in the midline.) .    Assessment & Plan Kathryn Filler MD; 03/09/2020 10:43 AM) Sherald Hess HERNIA, WITHOUT OBSTRUCTION OR GANGRENE (K43.2) Impression: Patient is a 44 year old female with an incisional hernia just above the umbilicus. This appears to be very small however patient is very symptomatic.  1. Will proceed to the operating roomFor a open incisional hernia repair with mesh. 2. All risks and benefits were discussed with the patient to generally include, but not limited to: infection, bleeding, damage to surrounding structures, acute and chronic nerve pain, and recurrence. Alternatives were offered and described. All questions were answered and the patient voiced understanding of the procedure and wishes to proceed at this point with hernia repair.

## 2020-03-17 ENCOUNTER — Encounter: Payer: Self-pay | Admitting: Nurse Practitioner

## 2020-03-28 ENCOUNTER — Other Ambulatory Visit: Payer: Self-pay

## 2020-03-28 ENCOUNTER — Ambulatory Visit (INDEPENDENT_AMBULATORY_CARE_PROVIDER_SITE_OTHER): Payer: BC Managed Care – PPO | Admitting: Nurse Practitioner

## 2020-03-28 VITALS — BP 108/64 | HR 60 | Temp 97.8°F | Wt 165.0 lb

## 2020-03-28 DIAGNOSIS — R0602 Shortness of breath: Secondary | ICD-10-CM | POA: Diagnosis not present

## 2020-03-28 DIAGNOSIS — R0683 Snoring: Secondary | ICD-10-CM

## 2020-03-28 DIAGNOSIS — R059 Cough, unspecified: Secondary | ICD-10-CM

## 2020-03-28 DIAGNOSIS — R05 Cough: Secondary | ICD-10-CM

## 2020-03-28 DIAGNOSIS — Z8616 Personal history of COVID-19: Secondary | ICD-10-CM

## 2020-03-28 DIAGNOSIS — J1282 Pneumonia due to coronavirus disease 2019: Secondary | ICD-10-CM

## 2020-03-28 DIAGNOSIS — R519 Headache, unspecified: Secondary | ICD-10-CM

## 2020-03-28 DIAGNOSIS — U071 COVID-19: Secondary | ICD-10-CM

## 2020-03-28 NOTE — Progress Notes (Signed)
@Patient  ID: , female    DOB: 1976/02/14, 44 y.o.   MRN: [id number]  Chief Complaint  Patient presents with  . Headache    3 week follow up, stil having headaches. Neurology appt 10/13    Referring provider: 11/13, NP   44 year old female with no significant health history - diagnosed with Covid on 02/14/20.   History of current illness:  02/14/20 ED visit: Diagnosed with Covid Pneumonia - prescribed Tessalon perles, prednisone taper, and albuterol inhaler  02/18/20 ED visit: blood cultures - negative x2, prescribed hycodan and zofran  02/18/20: MAB infusion  03/07/20 Follow up chest xray: Coarse linear infiltrates in the lungs, progressing since previous study. This may represent residual infiltration or developing scarring. Prednisone prescribed - referral placed to pulmonary.   HPI  Patient presents today for post COVID care clinic visit. Spanish interpreter was used for visit today. Patient was diagnosed with COVID Pneumoniaon 8/16/2021at ED visit. She received a monoclonal antibody infusion on 02/18/2020. Patient has been prescribed Tessalon Perles, prednisone taper, albuterol inhaler, Hycodan, and Zofran with past 2 ED visits recently. ED did perform blood cultures x2 which both came back negative.  Patient states that she is slowly improving.  She does still complain of some shortness of breath with exertion, headache, and body aches. Patient does have upcoming appointments scheduled with pulmonary and neurology in the next couple of weeks. At last visit we discussed discussed that she will need to see neurology possibly get a sleep study to rule out sleep apnea. Patient is a heavy snorer. If she does have sleep apnea this could be causing headache and fatigue during the day.  I am also concerned that her O2 level may be dropping at night. Denies f/c/s, n/v/d, hemoptysis, PND, chest pain or edema.      No Known Allergies  Immunization History   Administered Date(s) Administered  . Tdap 09/21/2019    History reviewed. No pertinent past medical history.  Tobacco History: Social History   Tobacco Use  Smoking Status Never Smoker  Smokeless Tobacco Never Used   Counseling given: Not Answered   Outpatient Encounter Medications as of 03/28/2020  Medication Sig  . albuterol (VENTOLIN HFA) 108 (90 Base) MCG/ACT inhaler Inhale 2 puffs into the lungs every 4 (four) hours as needed for wheezing or shortness of breath.  . benzonatate (TESSALON) 100 MG capsule Take 1 capsule (100 mg total) by mouth every 8 (eight) hours. (Patient not taking: Reported on 03/07/2020)  . HYDROcodone-homatropine (HYCODAN) 5-1.5 MG/5ML syrup Take 5 mLs by mouth every 6 (six) hours as needed for cough. (Patient not taking: Reported on 03/07/2020)  . ibuprofen (ADVIL) 600 MG tablet Take 1 tablet (600 mg total) by mouth every 8 (eight) hours as needed. (Patient not taking: Reported on 03/07/2020)  . ondansetron (ZOFRAN ODT) 4 MG disintegrating tablet Take 1 tablet (4 mg total) by mouth every 8 (eight) hours as needed for nausea or vomiting. (Patient not taking: Reported on 03/07/2020)  . polyethylene glycol powder (GLYCOLAX/MIRALAX) 17 GM/SCOOP powder Take 17 g by mouth 2 (two) times daily as needed. (Patient not taking: Reported on 12/28/2019)  . [DISCONTINUED] predniSONE (DELTASONE) 10 MG tablet Take 4 tabs for 2 days, then 3 tabs for 2 days, then 2 tabs for 2 days, then 1 tab for 2 days, then stop   No facility-administered encounter medications on file as of 03/28/2020.     Review of Systems  Review of Systems  Constitutional: Negative.  Negative for fatigue and fever.  HENT: Negative.   Respiratory: Positive for shortness of breath. Negative for cough.   Cardiovascular: Negative.  Negative for chest pain, palpitations and leg swelling.  Gastrointestinal: Negative.   Musculoskeletal: Positive for arthralgias and myalgias.  Allergic/Immunologic: Negative.    Neurological: Positive for headaches.  Psychiatric/Behavioral: Negative.        Physical Exam  BP 108/64 (BP Location: Left Arm)   Pulse 60   Temp 97.8 F (36.6 C)   Wt 165 lb 0.1 oz (74.8 kg)   SpO2 97%   BMI 36.34 kg/m   Wt Readings from Last 5 Encounters:  03/28/20 165 lb 0.1 oz (74.8 kg)  03/07/20 164 lb 0.1 oz (74.4 kg)  02/22/20 161 lb 0.1 oz (73 kg)  02/14/20 180 lb (81.6 kg)  12/28/19 158 lb (71.7 kg)     Physical Exam Vitals and nursing note reviewed.  Constitutional:      General: She is not in acute distress.    Appearance: She is well-developed.  Cardiovascular:     Rate and Rhythm: Normal rate and regular rhythm.  Pulmonary:     Effort: Pulmonary effort is normal.     Breath sounds: Normal breath sounds.  Neurological:     Mental Status: She is alert and oriented to person, place, and time.  Psychiatric:        Mood and Affect: Mood normal.        Behavior: Behavior normal.       Imaging: DG Chest 2 View  Result Date: 03/07/2020 CLINICAL DATA:  History of COVID pneumonia EXAM: CHEST - 2 VIEW COMPARISON:  02/17/2020 FINDINGS: Heart size and pulmonary vascularity are normal. Coarse linear infiltrates in the lungs, progressing since previous study. Peribronchial thickening. No pleural effusions. No pneumothorax. Mediastinal contours appear intact. IMPRESSION: Coarse linear infiltrates in the lungs, progressing since previous study. This may represent residual infiltration or developing scarring. Electronically Signed   By: Burman Nieves M.D.   On: 03/07/2020 22:55     Assessment & Plan:   History of COVID-19 Continue vitamin C, zinc, Vitamin D3, and quercetin 500 mg PO BID  Deep breathing exercises  Stay active  Stay well hydrated  Please keep upcoming appointment with Pulmonary   Snoring Fatigue Headache:  May start magnesium 600 mg daily  Stay well hydrated  Please keep upcoming appointment with neurology - may need  sleep study   Follow up:  Follow up in 1 month     Ivonne Andrew, NP 03/28/2020

## 2020-03-28 NOTE — Assessment & Plan Note (Signed)
Continue vitamin C, zinc, Vitamin D3, and quercetin 500 mg PO BID  Deep breathing exercises  Stay active  Stay well hydrated  Please keep upcoming appointment with Pulmonary   Snoring Fatigue Headache:  May start magnesium 600 mg daily  Stay well hydrated  Please keep upcoming appointment with neurology - may need sleep study   Follow up:  Follow up in 1 month

## 2020-03-28 NOTE — Patient Instructions (Signed)
Pneumonia due to COVID-19 virus:   Continue vitamin C, zinc, Vitamin D3, and quercetin 500 mg PO BID  Deep breathing exercises  Stay active  Stay well hydrated  Please keep upcoming appointment with Pulmonary   Snoring Fatigue Headache:  May start magnesium 600 mg daily  Stay well hydrated  Please keep upcoming appointment with neurology - may need sleep study   Follow up:  Follow up in 1 month

## 2020-04-05 ENCOUNTER — Other Ambulatory Visit: Payer: Self-pay

## 2020-04-05 ENCOUNTER — Ambulatory Visit: Payer: BC Managed Care – PPO | Admitting: Pulmonary Disease

## 2020-04-05 ENCOUNTER — Ambulatory Visit (INDEPENDENT_AMBULATORY_CARE_PROVIDER_SITE_OTHER): Payer: BC Managed Care – PPO

## 2020-04-05 ENCOUNTER — Encounter: Payer: Self-pay | Admitting: Pulmonary Disease

## 2020-04-05 VITALS — BP 118/72 | HR 52 | Ht 60.0 in | Wt 164.6 lb

## 2020-04-05 DIAGNOSIS — U099 Post covid-19 condition, unspecified: Secondary | ICD-10-CM | POA: Diagnosis not present

## 2020-04-05 DIAGNOSIS — R059 Cough, unspecified: Secondary | ICD-10-CM

## 2020-04-05 DIAGNOSIS — R0602 Shortness of breath: Secondary | ICD-10-CM

## 2020-04-05 DIAGNOSIS — J189 Pneumonia, unspecified organism: Secondary | ICD-10-CM | POA: Diagnosis not present

## 2020-04-05 NOTE — Patient Instructions (Signed)
I am glad to getting better from COVID-19 infection We will get a chest x-ray today and schedule pulmonary function test for assessment of the lung Continue exercise  Follow-up in 3 months.

## 2020-04-05 NOTE — Progress Notes (Addendum)
Kathryn Sanders    [id number]    February 24, 1976  Primary Care Physician:Edwards, Kinnie Scales, NP  Referring Physician: Ivonne Andrew, NP [address] [place],  [place] [address]  Chief complaint: Follow-up for post COVID-76  HPI: 44 year old with history of hiatal hernia, abdominal surgery in [place] for unspecified tumor in 2017 Diagnosed with COVID-19 on 02/14/2020.  She was treated with monoclonal antibody.  She had several ED visits for dyspnea and treated with Tessalon, prednisone taper, albuterol inhaler, Hycodan and Zofran.  She did not require hospitalization  Overall she has made some improvement.  Continues to have dyspnea on exertion, fatigue, body ache However she has been able to return to work. Referred to neurology for assessment of sleep apnea.  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  No feather pillows or comforters Smoking history: Never smoker Travel history: From [place].  Immigrated around 2020 Relevant family history: Mother had recurrent pneumonias.  Outpatient Encounter Medications as of 04/05/2020  Medication Sig  . albuterol (VENTOLIN HFA) 108 (90 Base) MCG/ACT inhaler Inhale 2 puffs into the lungs every 4 (four) hours as needed for wheezing or shortness of breath.  . ondansetron (ZOFRAN ODT) 4 MG disintegrating tablet Take 1 tablet (4 mg total) by mouth every 8 (eight) hours as needed for nausea or vomiting.  . polyethylene glycol powder (GLYCOLAX/MIRALAX) 17 GM/SCOOP powder Take 17 g by mouth 2 (two) times daily as needed.  . benzonatate (TESSALON) 100 MG capsule Take 1 capsule (100 mg total) by mouth every 8 (eight) hours. (Patient not taking: Reported on 03/07/2020)  . HYDROcodone-homatropine (HYCODAN) 5-1.5 MG/5ML syrup Take 5 mLs by mouth every 6 (six) hours as needed for cough. (Patient not taking: Reported on 04/05/2020)  . ibuprofen (ADVIL) 600 MG tablet Take 1 tablet (600  mg total) by mouth every 8 (eight) hours as needed. (Patient not taking: Reported on 04/05/2020)   No facility-administered encounter medications on file as of 04/05/2020.    Allergies as of 04/05/2020  . (No Known Allergies)    No past medical history on file.  Past Surgical History:  Procedure Laterality Date  . ABDOMINAL SURGERY      Family History  Problem Relation Age of Onset  . Healthy Mother   . Healthy Father     Social History   Socioeconomic History  . Marital status: Single    Spouse name: Not on file  . Number of children: Not on file  . Years of education: Not on file  . Highest education level: Not on file  Occupational History  . Not on file  Tobacco Use  . Smoking status: Never Smoker  . Smokeless tobacco: Never Used  Substance and Sexual Activity  . Alcohol use: Never  . Drug use: Never  . Sexual activity: Not Currently  Other Topics Concern  . Not on file  Social History Narrative  . Not on file   Social Determinants of Health   Financial Resource Strain:   . Difficulty of Paying Living Expenses: Not on file  Food Insecurity:   . Worried About Programme researcher, broadcasting/film/video in the Last Year: Not on file  . Ran Out of Food in the Last Year: Not on file  Transportation Needs:   . Lack of Transportation (Medical): Not on file  . Lack of Transportation (Non-Medical): Not on file  Physical Activity:   . Days  of Exercise per Week: Not on file  . Minutes of Exercise per Session: Not on file  Stress:   . Feeling of Stress : Not on file  Social Connections:   . Frequency of Communication with Friends and Family: Not on file  . Frequency of Social Gatherings with Friends and Family: Not on file  . Attends Religious Services: Not on file  . Active Member of Clubs or Organizations: Not on file  . Attends Banker Meetings: Not on file  . Marital Status: Not on file  Intimate Partner Violence:   . Fear of Current or Ex-Partner: Not on file  .  Emotionally Abused: Not on file  . Physically Abused: Not on file  . Sexually Abused: Not on file    Review of systems: Review of Systems  Constitutional: Negative for fever and chills.  HENT: Negative.   Eyes: Negative for blurred vision.  Respiratory: as per HPI  Cardiovascular: Negative for chest pain and palpitations.  Gastrointestinal: Negative for vomiting, diarrhea, blood per rectum. Genitourinary: Negative for dysuria, urgency, frequency and hematuria.  Musculoskeletal: Negative for myalgias, back pain and joint pain.  Skin: Negative for itching and rash.  Neurological: Negative for dizziness, tremors, focal weakness, seizures and loss of consciousness.  Endo/Heme/Allergies: Negative for environmental allergies.  Psychiatric/Behavioral: Negative for depression, suicidal ideas and hallucinations.  All other systems reviewed and are negative.  Physical Exam: Blood pressure 118/72, pulse (!) 52, height 5' (1.524 m), weight 164 lb 9.6 oz (74.7 kg), SpO2 99 %. Gen:      No acute distress HEENT:  EOMI, sclera anicteric Neck:     No masses; no thyromegaly Lungs:    Clear to auscultation bilaterally; normal respiratory effort CV:         Regular rate and rhythm; no murmurs Abd:      + bowel sounds; soft, non-tender; no palpable masses, no distension Ext:    No edema; adequate peripheral perfusion Skin:      Warm and dry; no rash Neuro: alert and oriented x 3 Psych: normal mood and affect  Data Reviewed: Imaging: Chest x-ray 02/17/2020-mild infiltrate at the bases Chest x-ray 03/07/2020-progression of cranial infiltrates Chest x-ray 04/05/2020-pending  PFTs:  Labs:  Assessment:  Post COVID-19 Discussed with patient via interpreter Set expectations for slow and prolonged recovery Thankfully she is getting better over time and has returned to work  Advised to continue increasing the amount of exercise she does including climbing stairs slowly over time We will get a chest  x-ray and PFTs today.  If abnormal then follow-up with high-resolution CT  Plan/Recommendations: Chest x-ray, PFTs  Chilton Greathouse MD [facility] 04/05/2020, 9:00 AM  CC: Ivonne Andrew, NP

## 2020-04-12 ENCOUNTER — Encounter: Payer: Self-pay | Admitting: Neurology

## 2020-04-12 ENCOUNTER — Other Ambulatory Visit: Payer: Self-pay

## 2020-04-12 ENCOUNTER — Ambulatory Visit: Payer: BC Managed Care – PPO | Admitting: Neurology

## 2020-04-12 VITALS — BP 116/70 | HR 62 | Ht 61.0 in | Wt 165.3 lb

## 2020-04-12 DIAGNOSIS — R351 Nocturia: Secondary | ICD-10-CM

## 2020-04-12 DIAGNOSIS — E669 Obesity, unspecified: Secondary | ICD-10-CM | POA: Diagnosis not present

## 2020-04-12 DIAGNOSIS — Z820 Family history of epilepsy and other diseases of the nervous system: Secondary | ICD-10-CM

## 2020-04-12 DIAGNOSIS — R0683 Snoring: Secondary | ICD-10-CM | POA: Diagnosis not present

## 2020-04-12 DIAGNOSIS — Z8616 Personal history of COVID-19: Secondary | ICD-10-CM | POA: Diagnosis not present

## 2020-04-12 DIAGNOSIS — E66811 Obesity, class 1: Secondary | ICD-10-CM

## 2020-04-12 DIAGNOSIS — G4719 Other hypersomnia: Secondary | ICD-10-CM | POA: Diagnosis not present

## 2020-04-12 DIAGNOSIS — R519 Headache, unspecified: Secondary | ICD-10-CM

## 2020-04-12 NOTE — Patient Instructions (Signed)

## 2020-04-12 NOTE — Progress Notes (Signed)
Subjective:    Patient ID: Kathryn Sanders is a 44 y.o. female.  HPI     [name], MD, PhD [facility] [address] [address] [place], [place] [address]  Dear [name],   I saw your patient, Kathryn Sanders, upon your kind request in my sleep clinic today for initial consultation of her sleep disorder, in particular, concern for underlying obstructive sleep apnea.  The patient is accompanied by a Spanish interpreter today. As you know, Ms. Kathryn Sanders is a 44 year old right-handed woman with an underlying medical history of Covid related pneumonia in August 2021 and mild obesity, who reports snoring and excessive daytime somnolence as well as recurrent headaches.  I reviewed your office note from 03/07/2020.  She had a recent appointment with pulmonology.  Her Epworth sleepiness score is 13/24, fatigue severity score is 61 out of 63.  She lives with her daughter, son-in-law and 79-year-old granddaughter.  She is separated.  Her daughter has sleep apnea and has a CPAP machine.  Patient would be willing to consider a CPAP machine.  She does report waking up with a sense of gasping at times and snoring.  Sometimes she has shortness of breath at night.  She uses an inhaler as needed.  She goes to bed around 8 or 9 and rise time is between 7 and 8 typically if she does not have to work the next day.  On Saturdays, Sundays and Mondays she has to get up at 4 AM.  She works in [employer].  She works from 6 AM to 6:30 PM, 3 days a week.  She is a non-smoker and does not drink alcohol and no caffeine on a daily basis.  She has nocturia about 3 times per average night and has woken up with a headache.  She does not typically take anything for her headaches, no over-the-counter medication.  Her Past Medical History Is Significant For: No past medical history on file.  Her Past Surgical History Is Significant For: Past Surgical History:  Procedure Laterality Date  . ABDOMINAL  SURGERY      Her Family History Is Significant For: Family History  Problem Relation Age of Onset  . Healthy Mother   . Healthy Father     Her Social History Is Significant For: Social History   Socioeconomic History  . Marital status: Single    Spouse name: Not on file  . Number of children: Not on file  . Years of education: Not on file  . Highest education level: Not on file  Occupational History  . Not on file  Tobacco Use  . Smoking status: Never Smoker  . Smokeless tobacco: Never Used  Substance and Sexual Activity  . Alcohol use: Never  . Drug use: Never  . Sexual activity: Not Currently  Other Topics Concern  . Not on file  Social History Narrative  . Not on file   Social Determinants of Health   Financial Resource Strain:   . Difficulty of Paying Living Expenses: Not on file  Food Insecurity:   . Worried About Programme researcher, broadcasting/film/video in the Last Year: Not on file  . Ran Out of Food in the Last Year: Not on file  Transportation Needs:   . Lack of Transportation (Medical): Not on file  . Lack of Transportation (Non-Medical): Not on file  Physical Activity:   . Days of Exercise per Week: Not on file  . Minutes of Exercise per Session:  Not on file  Stress:   . Feeling of Stress : Not on file  Social Connections:   . Frequency of Communication with Friends and Family: Not on file  . Frequency of Social Gatherings with Friends and Family: Not on file  . Attends Religious Services: Not on file  . Active Member of Clubs or Organizations: Not on file  . Attends Banker Meetings: Not on file  . Marital Status: Not on file    Her Allergies Are:  No Known Allergies:   Her Current Medications Are:  Outpatient Encounter Medications as of 04/12/2020  Medication Sig  . albuterol (VENTOLIN HFA) 108 (90 Base) MCG/ACT inhaler Inhale 2 puffs into the lungs every 4 (four) hours as needed for wheezing or shortness of breath.  . benzonatate (TESSALON) 100 MG  capsule Take 1 capsule (100 mg total) by mouth every 8 (eight) hours.  Marland Kitchen HYDROcodone-homatropine (HYCODAN) 5-1.5 MG/5ML syrup Take 5 mLs by mouth every 6 (six) hours as needed for cough.  Marland Kitchen ibuprofen (ADVIL) 600 MG tablet Take 1 tablet (600 mg total) by mouth every 8 (eight) hours as needed.  . ondansetron (ZOFRAN ODT) 4 MG disintegrating tablet Take 1 tablet (4 mg total) by mouth every 8 (eight) hours as needed for nausea or vomiting.  . polyethylene glycol powder (GLYCOLAX/MIRALAX) 17 GM/SCOOP powder Take 17 g by mouth 2 (two) times daily as needed.   No facility-administered encounter medications on file as of 04/12/2020.  :  Review of Systems:  Out of a complete 14 point review of systems, all are reviewed and negative with the exception of these symptoms as listed below: Review of Systems  Neurological:       Here for sleep consult. No prior sleep study. Reports she does snore at night.  Epworth Sleepiness Scale 0= would never doze 1= slight chance of dozing 2= moderate chance of dozing 3= high chance of dozing  Sitting and reading:0 Watching TV:3 Sitting inactive in a public place (ex. Theater or meeting):2 As a passenger in a car for an hour without a break:3 Lying down to rest in the afternoon:3 Sitting and talking to someone:0 Sitting quietly after lunch (no alcohol):2 In a car, while stopped in traffic:0 Total:13     Objective:  Neurological Exam  Physical Exam Physical Examination:   Vitals:   04/12/20 1321  BP: 116/70  Pulse: 62  SpO2: 97%    General Examination: The patient is a very pleasant 44 y.o. female in no acute distress. She appears well-developed and well-nourished and well groomed.   HEENT: Normocephalic, atraumatic, pupils are equal, round and reactive to light, extraocular tracking is good without limitation to gaze excursion or nystagmus noted. Hearing is grossly intact. Face is symmetric with normal facial animation. Speech is clear with no  dysarthria noted. There is no hypophonia. There is no lip, neck/head, jaw or voice tremor. Neck is supple with full range of passive and active motion. There are no carotid bruits on auscultation. Oropharynx exam reveals: mild mouth dryness, adequate dental hygiene and moderate airway crowding, due to small airway entry, tonsils are small, uvula small, Mallampati class III, normal sized tongue, neck circumference of 14-1/2 inches.  She has a mild overbite.  Tongue protrudes centrally and palate elevates symmetrically.  Chest: Clear to auscultation without wheezing, rhonchi or crackles noted.  Heart: S1+S2+0, regular and normal without murmurs, rubs or gallops noted.   Abdomen: Soft, non-tender and non-distended with normal bowel sounds appreciated on auscultation.  Extremities: There is no pitting edema in the distal lower extremities bilaterally.   Skin: Warm and dry without trophic changes noted.   Musculoskeletal: exam reveals no obvious joint deformities, tenderness or joint swelling or erythema.   Neurologically:  Mental status: The patient is awake, alert and oriented in all 4 spheres. Her immediate and remote memory, attention, language skills and fund of knowledge are appropriate. There is no evidence of aphasia, agnosia, apraxia or anomia. Speech is clear with normal prosody and enunciation. Thought process is linear. Mood is normal and affect is normal.  Cranial nerves II - XII are as described above under HEENT exam.  Motor exam: Normal bulk, strength and tone is noted. There is no tremor, Romberg is negative. Fine motor skills and coordination: grossly intact.  Cerebellar testing: No dysmetria or intention tremor. There is no truncal or gait ataxia.  Sensory exam: intact to light touch in the upper and lower extremities.  Gait, station and balance: She stands easily. No veering to one side is noted. No leaning to one side is noted. Posture is age-appropriate and stance is narrow  based. Gait shows normal stride length and normal pace. No problems turning are noted. Tandem walk is unremarkable.                Assessment and Plan:  In summary, Altie Eular Panek is a very pleasant 44 y.o.-year old female with an underlying medical history of Covid related pneumonia in August 2021 and mild obesity, whose history and physical exam are concerning for obstructive sleep apnea (OSA). I had a long chat with the patient about my findings and the diagnosis of OSA, its prognosis and treatment options. We talked about medical treatments, surgical interventions and non-pharmacological approaches. I explained in particular the risks and ramifications of untreated moderate to severe OSA, especially with respect to developing cardiovascular disease down the Road, including congestive heart failure, difficult to treat hypertension, cardiac arrhythmias, or stroke. Even type 2 diabetes has, in part, been linked to untreated OSA. Symptoms of untreated OSA include daytime sleepiness, memory problems, mood irritability and mood disorder such as depression and anxiety, lack of energy, as well as recurrent headaches, especially morning headaches. We talked about trying to maintain a healthy lifestyle in general, as well as the importance of weight control. We also talked about the importance of good sleep hygiene. I recommended the following at this time: sleep study.  I explained the sleep test procedure to the patient and also outlined possible surgical and non-surgical treatment options of OSA, including the use of a custom-made dental device (which would require a referral to a specialist dentist or oral surgeon), upper airway surgical options, such as traditional UPPP or a novel less invasive surgical option in the form of Inspire hypoglossal nerve stimulation (which would involve a referral to an ENT surgeon). I also explained the CPAP treatment option to the patient, who indicated that she would be willing  to try CPAP if the need arises. I answered all her questions today and the patient was in agreement. I plan to see her back after the sleep study is completed and encouraged her to call with any interim questions, concerns, problems or updates.   Thank you very much for allowing me to participate in the care of this nice patient. If I can be of any further assistance to you please do not hesitate to call me at [phone].  Sincerely,   [name], MD, PhD

## 2020-04-24 NOTE — Progress Notes (Signed)
Using [facility], [place] [id number]  Message left for patient using interepreter.  Patient does not need to be retested for COVID for procedure on 04/28/20 because her positive COVID test was 02/14/20, Patient does not need to be retested within 90 days of positive COVID result

## 2020-04-25 ENCOUNTER — Ambulatory Visit: Payer: BC Managed Care – PPO

## 2020-04-25 ENCOUNTER — Other Ambulatory Visit (HOSPITAL_COMMUNITY): Payer: BC Managed Care – PPO

## 2020-04-28 ENCOUNTER — Ambulatory Visit: Payer: BC Managed Care – PPO

## 2020-04-28 ENCOUNTER — Other Ambulatory Visit: Payer: Self-pay

## 2020-05-03 ENCOUNTER — Ambulatory Visit (INDEPENDENT_AMBULATORY_CARE_PROVIDER_SITE_OTHER): Payer: BC Managed Care – PPO | Admitting: Neurology

## 2020-05-03 DIAGNOSIS — G4733 Obstructive sleep apnea (adult) (pediatric): Secondary | ICD-10-CM | POA: Diagnosis not present

## 2020-05-03 DIAGNOSIS — Z820 Family history of epilepsy and other diseases of the nervous system: Secondary | ICD-10-CM

## 2020-05-03 DIAGNOSIS — R351 Nocturia: Secondary | ICD-10-CM

## 2020-05-03 DIAGNOSIS — Z8616 Personal history of COVID-19: Secondary | ICD-10-CM

## 2020-05-03 DIAGNOSIS — E669 Obesity, unspecified: Secondary | ICD-10-CM

## 2020-05-03 DIAGNOSIS — R0683 Snoring: Secondary | ICD-10-CM

## 2020-05-03 DIAGNOSIS — G4719 Other hypersomnia: Secondary | ICD-10-CM

## 2020-05-03 DIAGNOSIS — R519 Headache, unspecified: Secondary | ICD-10-CM

## 2020-05-03 DIAGNOSIS — E66811 Obesity, class 1: Secondary | ICD-10-CM

## 2020-05-04 ENCOUNTER — Ambulatory Visit (INDEPENDENT_AMBULATORY_CARE_PROVIDER_SITE_OTHER): Payer: BC Managed Care – PPO | Admitting: Nurse Practitioner

## 2020-05-04 VITALS — BP 132/82 | HR 61 | Temp 96.8°F | Ht 61.0 in | Wt 165.0 lb

## 2020-05-04 DIAGNOSIS — U071 COVID-19: Secondary | ICD-10-CM

## 2020-05-04 DIAGNOSIS — J1282 Pneumonia due to coronavirus disease 2019: Secondary | ICD-10-CM | POA: Diagnosis not present

## 2020-05-04 DIAGNOSIS — G9331 Postviral fatigue syndrome: Secondary | ICD-10-CM | POA: Insufficient documentation

## 2020-05-04 DIAGNOSIS — Z8616 Personal history of COVID-19: Secondary | ICD-10-CM

## 2020-05-04 DIAGNOSIS — G933 Postviral fatigue syndrome: Secondary | ICD-10-CM | POA: Diagnosis not present

## 2020-05-04 NOTE — Progress Notes (Signed)
@Patient  ID: , female    DOB: 02/24/1976, 44 y.o.   MRN: [id number]  Chief Complaint  Patient presents with  . Follow-up    Feeling better just some fatigue. Already in with Pulmonary and neurology    Referring provider: [id number], NP   44 year old female with no significant health history - diagnosed with Covid on 02/14/20.   History of current illness:  02/14/20 ED visit: Diagnosed with Covid Pneumonia - prescribed Tessalon perles, prednisone taper, and albuterol inhaler  02/18/20 ED visit: blood cultures - negative x2, prescribed hycodan and zofran  02/18/20: MAB infusion  03/07/20 Follow up chest xray: Coarse linear infiltrates in the lungs, progressing since previous study. This may represent residual infiltration or developing scarring. Prednisone prescribed - referral placed to pulmonary.   03/28/20: Post Covid Care: Referral to pulmonary and neurology  04/05/20: Pulmonary: order chest xray - showed improvement, ordered sleep study - scheduled for next week  04/12/20: Neurology: ordered sleep study - completed last night  HPI  Patient presents today for post COVID care clinic visit/follow-up.  Patient was last seen here on 03/28/2020.  Since her last visit she has been seen by pulmonary and neurology.  Neurology ordered a sleep study and this was completed yesterday.  Patient has not received results at this time.  Pulmonary ordered repeat chest x-ray which showed good improvement.  And also ordered a PFT which is scheduled for next week.  Patient states that overall she is improving.  She does continue to complain of fatigue and some anxiety.  We discussed a physical therapy referral to possibly help with fatigue, strengthening, deconditioning.  Patient is agreeable to this.Denies f/c/s, n/v/d, hemoptysis, PND, chest pain or edema.     No Known Allergies  Immunization History  Administered Date(s) Administered  . Tdap 09/21/2019    History  reviewed. No pertinent past medical history.  Tobacco History: Social History   Tobacco Use  Smoking Status Never Smoker  Smokeless Tobacco Never Used   Counseling given: Not Answered   Outpatient Encounter Medications as of 05/04/2020  Medication Sig  . albuterol (VENTOLIN HFA) 108 (90 Base) MCG/ACT inhaler Inhale 2 puffs into the lungs every 4 (four) hours as needed for wheezing or shortness of breath.  . benzonatate (TESSALON) 100 MG capsule Take 1 capsule (100 mg total) by mouth every 8 (eight) hours. (Patient not taking: Reported on 05/04/2020)  . HYDROcodone-homatropine (HYCODAN) 5-1.5 MG/5ML syrup Take 5 mLs by mouth every 6 (six) hours as needed for cough. (Patient not taking: Reported on 05/04/2020)  . ibuprofen (ADVIL) 600 MG tablet Take 1 tablet (600 mg total) by mouth every 8 (eight) hours as needed. (Patient not taking: Reported on 05/04/2020)  . ondansetron (ZOFRAN ODT) 4 MG disintegrating tablet Take 1 tablet (4 mg total) by mouth every 8 (eight) hours as needed for nausea or vomiting. (Patient not taking: Reported on 05/04/2020)  . polyethylene glycol powder (GLYCOLAX/MIRALAX) 17 GM/SCOOP powder Take 17 g by mouth 2 (two) times daily as needed. (Patient not taking: Reported on 05/04/2020)   No facility-administered encounter medications on file as of 05/04/2020.     Review of Systems  Review of Systems  Constitutional: Positive for fatigue. Negative for fever.  HENT: Negative.   Respiratory: Negative for cough and shortness of breath.   Cardiovascular: Negative.  Negative for chest pain, palpitations and leg swelling.  Gastrointestinal: Negative.   Allergic/Immunologic: Negative.   Neurological: Negative.   Psychiatric/Behavioral: Negative.  Physical Exam  BP 132/82 (BP Location: Left Arm)   Pulse 61   Temp (!) 96.8 F (36 C)   Ht 5\' 1"  (1.549 m)   Wt 165 lb (74.8 kg)   SpO2 97%   BMI 31.18 kg/m   Wt Readings from Last 5 Encounters:  05/04/20 165 lb  (74.8 kg)  04/12/20 165 lb 5 oz (75 kg)  04/05/20 164 lb 9.6 oz (74.7 kg)  03/28/20 165 lb 0.1 oz (74.8 kg)  03/07/20 164 lb 0.1 oz (74.4 kg)     Physical Exam Vitals and nursing note reviewed.  Constitutional:      General: She is not in acute distress.    Appearance: She is well-developed.  Cardiovascular:     Rate and Rhythm: Normal rate and regular rhythm.  Pulmonary:     Effort: Pulmonary effort is normal.     Breath sounds: Normal breath sounds.  Musculoskeletal:     Right lower leg: No edema.     Left lower leg: No edema.  Neurological:     Mental Status: She is alert and oriented to person, place, and time.  Psychiatric:        Mood and Affect: Mood normal.        Behavior: Behavior normal.      Imaging: DG Chest 2 View  Result Date: 04/05/2020 CLINICAL DATA:  44 year old female with shortness of breath and cough. Status post COVID-19. EXAM: CHEST - 2 VIEW COMPARISON:  Chest radiographs 03/07/2020 and earlier. FINDINGS: Lung volumes and mediastinal contours remain normal. Regression of coarse bilateral pulmonary interstitial opacity since last month. No pneumothorax, pulmonary edema or pleural effusion. Subtle residual asymmetric increased interstitial markings in the left mid lung. No areas of worsening ventilation. No acute osseous abnormality identified. Negative visible bowel gas pattern. IMPRESSION: Largely resolved radiographic changes of COVID-19 pneumonia. Subtle residual in the left mid lung might be postinflammatory scarring. No new cardiopulmonary abnormality. Electronically Signed   By: 05/07/2020 M.D.   On: 04/05/2020 15:12     Assessment & Plan:   History of COVID-19 Continue vitamin C, zinc, Vitamin D3, and quercetin 500 mg PO BID  Deep breathing exercises  Stay active  Stay well hydrated  Please keep upcoming appointment with Pulmonary   Snoring Fatigue Headache:  May start magnesium 600 mg daily  Stay well hydrated  Please keep  upcoming appointment with neurology - may need sleep study  Will order PT   Follow up:  Follow up as needed     June, NP 05/04/2020

## 2020-05-04 NOTE — Patient Instructions (Signed)
Pneumonia due to COVID-19 virus:   Continue vitamin C, zinc, Vitamin D3, and quercetin 500 mg PO BID  Deep breathing exercises  Stay active  Stay well hydrated  Please keep upcoming appointment with Pulmonary   Snoring Fatigue Headache:  May start magnesium 600 mg daily  Stay well hydrated  Please keep upcoming appointment with neurology - may need sleep study  Will order PT   Follow up:  Follow up as needed

## 2020-05-04 NOTE — Assessment & Plan Note (Signed)
Continue vitamin C, zinc, Vitamin D3, and quercetin 500 mg PO BID  Deep breathing exercises  Stay active  Stay well hydrated  Please keep upcoming appointment with Pulmonary   Snoring Fatigue Headache:  May start magnesium 600 mg daily  Stay well hydrated  Please keep upcoming appointment with neurology - may need sleep study  Will order PT   Follow up:  Follow up as needed

## 2020-05-09 NOTE — Procedures (Signed)
   [facility]  HOME SLEEP TEST (Watch PAT)  STUDY DATE: 05/03/20  DOB: 12-09-1975  MRN: [id number]  ORDERING CLINICIAN: [name], MD, PhD   REFERRING CLINICIAN: Angus Seller, NP  CLINICAL INFORMATION/HISTORY: 44 year old woman with a history of Covid related pneumonia in August 2021 and mild obesity, who reports snoring and excessive daytime somnolence as well as recurrent headaches.   Epworth sleepiness score: 13/24.  BMI: 31.2 kg/m  FINDINGS:   Total Record Time (hours, min): 9 H 22 min   Total Sleep Time (hours, min):  6 H 54 min   Percent REM (%):    29.31 %   Calculated pAHI (per hour):  42.4        REM pAHI:    51.9     NREM pAHI: 38.8   Oxygen Saturation (%) Mean:  94  Minimum oxygen saturation (%):        84   O2 Saturation Range (%): 99-84  O2Saturation (minutes) <=88%: 2.7 min   Pulse Mean (bpm):          69 bpm  Pulse Range (105-48)   IMPRESSION: OSA (obstructive sleep apnea)  RECOMMENDATION:  This home sleep test demonstrates severe obstructive sleep apnea with a total AHI of 42.4/hour and O2 nadir of 84%. Treatment with positive airway pressure is recommended. The patient will be advised to proceed with an autoPAP titration/trial at home for now. A full night titration study may be considered to optimize treatment settings, if needed down the road. Alternative treatments may be limited, secondary to the severity of her sleep disordered breathing. Weight loss is recommended. Please note that untreated obstructive sleep apnea may carry additional perioperative morbidity. Patients with significant obstructive sleep apnea should receive perioperative PAP therapy and the surgeons and particularly the anesthesiologist should be informed of the diagnosis and the severity of the sleep disordered breathing. The patient should be cautioned not to drive, work at heights, or operate dangerous or heavy equipment when tired or sleepy. Review and  reiteration of good sleep hygiene measures should be pursued with any patient. Other causes of the patient's symptoms, including circadian rhythm disturbances, an underlying mood disorder, medication effect and/or an underlying medical problem cannot be ruled out based on this test. Clinical correlation is recommended. The patient and her referring provider will be notified of the test results. The patient will be seen in follow up in sleep clinic at [facility].  I certify that I have reviewed the raw data recording prior to the issuance of this report in accordance with the standards of the American Academy of Sleep Medicine (AASM).  INTERPRETING PHYSICIAN:    [name], MD, PhD  Board Certified in Neurology and Sleep Medicine  [facility] [address], [address] [place], [place] [address] [phone]

## 2020-05-09 NOTE — Progress Notes (Signed)
Interpreter needed.   Patient referred by Angus Seller, NP, seen by me on 04/12/20, HST on 05/03/20  Please call and notify the patient that the recent home sleep test showed obstructive sleep apnea in the severe range. I recommend she start treatment in the form of autoPAP, which means, that we don't have to bring her in for a sleep study with CPAP, but will let her try an autoPAP machine at home, through a DME company (of her choice, or as per insurance requirement). The DME representative will educate her on how to use the machine, how to put the mask on, etc. I have placed an order in the chart. Please send referral, talk to patient, send report to referring MD. We will need a FU in sleep clinic for 10 weeks post-PAP set up, please arrange that with me or one of our NPs. Thanks,   [name], MD, PhD [facility])

## 2020-05-09 NOTE — Addendum Note (Signed)
Addended by: [name] on: 05/09/2020 10:26 PM   Modules accepted: Orders

## 2020-05-10 ENCOUNTER — Telehealth: Payer: Self-pay

## 2020-05-10 NOTE — Telephone Encounter (Signed)
I called pt, using [employer], ID: [id number]. I advised pt that Dr. Frances Furbish reviewed their sleep study results and found that pt has severe osa. Dr. Frances Furbish recommends that pt start an auto pap at home. I reviewed PAP compliance expectations with the pt. Pt is agreeable to starting an auto-PAP. I advised pt that an order will be sent to a DME, [facility], and [facility] will call the pt within about one week after they file with the pt's insurance. [facility] will show the pt how to use the machine, fit for masks, and troubleshoot the auto-PAP if needed. A follow up appt was made for insurance purposes with Amy, NP on 08/16/2020 at 9:00am. Pt verbalized understanding to arrive 15 minutes early and bring their auto-PAP. A letter with all of this information in it will be mailed to the pt as a reminder. I verified with the pt that the address we have on file is correct. Pt verbalized understanding of results. Pt had no questions at this time but was encouraged to call back if questions arise. I have sent the order to [facility] and have received confirmation that they have received the order.

## 2020-05-10 NOTE — Telephone Encounter (Signed)
-----   Message from [name], MD sent at 05/09/2020 10:26 PM EST ----- Interpreter needed.   Patient referred by Angus Seller, NP, seen by me on 04/12/20, HST on 05/03/20  Please call and notify the patient that the recent home sleep test showed obstructive sleep apnea in the severe range. I recommend she start treatment in the form of autoPAP, which means, that we don't have to bring her in for a sleep study with CPAP, but will let her try an autoPAP machine at home, through a DME company (of her choice, or as per insurance requirement). The DME representative will educate her on how to use the machine, how to put the mask on, etc. I have placed an order in the chart. Please send referral, talk to patient, send report to referring MD. We will need a FU in sleep clinic for 10 weeks post-PAP set up, please arrange that with me or one of our NPs. Thanks,   [name], MD, PhD [facility])

## 2020-05-23 ENCOUNTER — Other Ambulatory Visit (HOSPITAL_COMMUNITY)
Admission: RE | Admit: 2020-05-23 | Discharge: 2020-05-23 | Disposition: A | Discharge status: Home / Self Care | Payer: BC Managed Care – PPO | Source: Ambulatory Visit | Attending: Pulmonary Disease | Admitting: Pulmonary Disease

## 2020-05-23 DIAGNOSIS — Z20822 Contact with and (suspected) exposure to covid-19: Secondary | ICD-10-CM | POA: Insufficient documentation

## 2020-05-23 DIAGNOSIS — Z01812 Encounter for preprocedural laboratory examination: Secondary | ICD-10-CM | POA: Insufficient documentation

## 2020-05-23 LAB — SARS CORONAVIRUS 2 (TAT 6-24 HRS): SARS Coronavirus 2: NEGATIVE

## 2020-05-24 ENCOUNTER — Other Ambulatory Visit: Payer: Self-pay

## 2020-05-24 ENCOUNTER — Ambulatory Visit: Payer: BC Managed Care – PPO | Attending: Nurse Practitioner

## 2020-05-24 DIAGNOSIS — Z7409 Other reduced mobility: Secondary | ICD-10-CM | POA: Diagnosis not present

## 2020-05-24 DIAGNOSIS — Z8616 Personal history of COVID-19: Secondary | ICD-10-CM | POA: Diagnosis not present

## 2020-05-24 DIAGNOSIS — G933 Postviral fatigue syndrome: Secondary | ICD-10-CM | POA: Diagnosis not present

## 2020-05-24 DIAGNOSIS — G9331 Postviral fatigue syndrome: Secondary | ICD-10-CM

## 2020-05-24 DIAGNOSIS — R262 Difficulty in walking, not elsewhere classified: Secondary | ICD-10-CM | POA: Insufficient documentation

## 2020-05-26 ENCOUNTER — Other Ambulatory Visit: Payer: Self-pay

## 2020-05-26 ENCOUNTER — Ambulatory Visit (INDEPENDENT_AMBULATORY_CARE_PROVIDER_SITE_OTHER): Payer: BC Managed Care – PPO | Admitting: Pulmonary Disease

## 2020-05-26 DIAGNOSIS — R0602 Shortness of breath: Secondary | ICD-10-CM | POA: Diagnosis not present

## 2020-05-26 DIAGNOSIS — R059 Cough, unspecified: Secondary | ICD-10-CM | POA: Diagnosis not present

## 2020-05-26 LAB — PULMONARY FUNCTION TEST
DL/VA % pred: 131 %
DL/VA: 6.08 ml/min/mmHg/L
DLCO cor % pred: 114 %
DLCO cor: 20.15 ml/min/mmHg
DLCO unc % pred: 114 %
DLCO unc: 20.15 ml/min/mmHg
FEF 25-75 Post: 3.58 L/sec
FEF 25-75 Pre: 3.28 L/sec
FEF2575-%Change-Post: 9 %
FEF2575-%Pred-Post: 131 %
FEF2575-%Pred-Pre: 120 %
FEV1-%Change-Post: 2 %
FEV1-%Pred-Post: 88 %
FEV1-%Pred-Pre: 86 %
FEV1-Post: 2.16 L
FEV1-Pre: 2.1 L
FEV1FVC-%Change-Post: 2 %
FEV1FVC-%Pred-Pre: 110 %
FEV6-%Change-Post: 0 %
FEV6-%Pred-Post: 80 %
FEV6-%Pred-Pre: 79 %
FEV6-Post: 2.35 L
FEV6-Pre: 2.33 L
FEV6FVC-%Pred-Post: 101 %
FEV6FVC-%Pred-Pre: 101 %
FVC-%Change-Post: 0 %
FVC-%Pred-Post: 78 %
FVC-%Pred-Pre: 78 %
FVC-Post: 2.35 L
FVC-Pre: 2.33 L
Post FEV1/FVC ratio: 92 %
Post FEV6/FVC ratio: 100 %
Pre FEV1/FVC ratio: 90 %
Pre FEV6/FVC Ratio: 100 %
RV % pred: 67 %
RV: 0.89 L
TLC % pred: 76 %
TLC: 3.19 L

## 2020-05-26 NOTE — Progress Notes (Signed)
PFT done today. 

## 2020-05-26 NOTE — Therapy (Addendum)
[facility] [address] [place], [place], [address] Phone: [phone]   Fax:  [phone]  Physical Therapy Evaluation / discharge   Patient Details  Name: Kathryn Sanders MRN: [id number] Date of Birth: May 24, 1976 Referring Provider (PT): Ivonne Andrew, NP   Encounter Date: 05/24/2020   PT End of Session - 05/26/20 0093    Visit Number 1    Number of Visits 9    Date for PT Re-Evaluation 07/15/20    Authorization Type [employer] COMM PPO    Authorization Time Period Re-assess FOTO on the 6th visit    PT Start Time 0920    PT Stop Time 1028    PT Time Calculation (min) 68 min    Activity Tolerance Patient tolerated treatment well    Behavior During Therapy [facility] for tasks assessed/performed           History reviewed. No pertinent past medical history.  Past Surgical History:  Procedure Laterality Date  . ABDOMINAL SURGERY      There were no vitals filed for this visit.    Subjective Assessment - 05/26/20 0735    Subjective Pt reports having covid 3 months ago and she is still experiencing symptoms of SOB and tightness of her lungs. Pt reports she walks daily for 15 mins, stopping to rest as needed.    Limitations Walking    How long can you walk comfortably? A few minutes    Diagnostic tests 04/05/20: IMPRESSION:Largely resolved radiographic changes of COVID-19 pneumonia. Subtleresidual in the left mid lung might be postinflammatory scarring. Nonew cardiopulmonary abnormality.IMPRESSION:Largely resolved radiographic changes of COVID-19 pneumonia. Subtleresidual in the left mid lung might be postinflammatory scarring. Nonew cardiopulmonary abnormality.              [facility] PT Assessment - 05/26/20 0001      Assessment   Medical Diagnosis History of COVID-19; Postviral fatigue syndrome    Referring Provider (PT) Ivonne Andrew, NP    Onset Date/Surgical Date 02/14/20    Hand Dominance Right    Next MD Visit --    yes, but unsure of date   Prior Therapy No      Precautions   Precautions None      Restrictions   Weight Bearing Restrictions No      Balance Screen   Has the patient fallen in the past 6 months No      Home Environment   Living Environment Private residence    Living Arrangements Children    Type of Home House    Home Access Stairs to enter    Entrance Stairs-Number of Steps 2    Entrance Stairs-Rails None    Home Layout One level      Prior Function   Level of Independence Independent    Vocation Full time employment      Cognition   Overall Cognitive Status Within Functional Limits for tasks assessed      Observation/Other Assessments   Focus on Therapeutic Outcomes (FOTO)  37%      Sensation   Light Touch Appears Intact      Posture/Postural Control   Posture/Postural Control No significant limitations      ROM / Strength   AROM / PROM / Strength AROM;Strength      AROM   Overall AROM Comments WNLs for UEs and LEs      Strength   Overall Strength Comments WNLs for UEs and LE  Transfers   Transfers Sit to Stand;Stand to Sit    Sit to Stand 7: Independent      Ambulation/Gait   Ambulation/Gait Yes    Ambulation/Gait Assistance 7: Independent    Gait Pattern Within Functional Limits;Step-through pattern   decreased pace     6 Minute Walk- Baseline   6 Minute Walk- Baseline yes    HR (bpm) 60    02 Sat (%RA) 98 %    Modified Borg Scale for Dyspnea 0- Nothing at all      6 Minute walk- Post Test   6 Minute Walk Post Test yes    HR (bpm) 70    02 Sat (%RA) 97 %    Modified Borg Scale for Dyspnea 5- Strong or hard breathing      6 minute walk test results    Aerobic Endurance Distance Walked 555    Endurance additional comments Pt need 3 rest breaks using pursed lip breathing for recovery                      Objective measurements completed on examination: See above findings.               PT Education - 05/26/20  0648    Education Details Eval findings, POC, Walking program- Gradually increasing walking time every 3-5 days until 30 mins. Don't exceed a 5/10 dyspnea level. Use pursed lip breathing for DOE recovery. Walk daily on work days, and 2x daily on non-work days.    Person(s) Educated Patient    Methods Explanation;Demonstration;Tactile cues    Comprehension Verbalized understanding;Returned demonstration;Verbal cues required            PT Short Term Goals - 05/26/20 0719      PT SHORT TERM GOAL #1   Title Pt will be ind in an inital walking program    Baseline Started on eval    Status New    Target Date 06/16/20      PT SHORT TERM GOAL #2   Title Pt will demostrate unstanding of the dyspnea exertion scale and pursed lip breathing    Status New    Target Date 06/16/20             PT Long Term Goals - 05/26/20 0722      PT LONG TERM GOAL #1   Title Pt will be Ind in a final walking program and HEP    Status New    Target Date 07/15/20      PT LONG TERM GOAL #2   Title Pt's 6 mwt test distance will improve to 1000 ft and completed without need for a rest break    Status New    Target Date 07/15/20      PT LONG TERM GOAL #3   Title Pt's FOTO score for functional ability will improve to 62%    Baseline 37% functional ability    Status New    Target Date 07/15/20                  Plan - 05/26/20 0657    Clinical Impression Statement Pt presents with decreased activity tolerance and DOE following having covid 3 mionths ago. With the 6 min test, pt walked at a decreased pace and needed 3 rests breaks. Pt's O2 sat and HR response was mild, while DOE was modrate to hard. Pt completed pursed lip breathing properly for the recovery of DOE. Pt was instructed in  a walking program to be completed as tolerated. P voiced understanding of the walking program. Pt will benefit from PT 1w8 to address strength, fatigue and decreased toleranceto avtivity.    Personal Factors and  Comorbidities Comorbidity 1    Comorbidities High BMI    Examination-Activity Limitations Locomotion Level;Stairs    Examination-Participation Restrictions Other    Stability/Clinical Decision Making Stable/Uncomplicated    Clinical Decision Making Low    Rehab Potential Good    PT Frequency 1x / week    PT Duration 8 weeks    PT Treatment/Interventions ADLs/Self Care Home Management;Gait training;Stair training;Functional mobility training;Therapeutic activities;Therapeutic exercise;Patient/family education    PT Next Visit Plan Assess pt's response to walking program. Initiate general strengthening.    PT Home Exercise Plan Walking program- Gradually increasing walking time every 3-5 days until 30 mins. Don't exceed a 5/10 dyspnea level. Use pursed lip breathing for DOE recovery. Walk daily on work days, and 2x daily on non-work days.    Consulted and Agree with Plan of Care Patient           Patient will benefit from skilled therapeutic intervention in order to improve the following deficits and impairments:  Difficulty walking, Cardiopulmonary status limiting activity, Decreased endurance, Obesity, Decreased activity tolerance  Visit Diagnosis: Postviral fatigue syndrome - Plan: PT plan of care cert/re-cert  History of COVID-19 - Plan: PT plan of care cert/re-cert  Difficulty in walking, not elsewhere classified - Plan: PT plan of care cert/re-cert  Impaired mobility and endurance - Plan: PT plan of care cert/re-cert     Problem List Patient Active Problem List   Diagnosis Date Noted  . Postviral fatigue syndrome 05/04/2020  . Snoring 03/07/2020  . Nonintractable headache 03/07/2020  . History of COVID-19 03/07/2020  . Pneumonia due to COVID-19 virus 02/22/2020  . Shortness of breath 02/22/2020  . Cough 02/22/2020   Joellyn Rued MS, PT 05/26/20 7:39 AM  [facility] [address] [place], [place], [address] Phone:  [phone]   Fax:  [phone]  Name: Kathryn Sanders MRN: [id number] Date of Birth: 02/08/1976       Pt discharged due to not returning since evaluation Lulu Riding PT, DPT, LAT, ATC  08/24/20  9:50 AM

## 2020-05-31 ENCOUNTER — Ambulatory Visit: Payer: BC Managed Care – PPO | Attending: Nurse Practitioner

## 2020-06-07 ENCOUNTER — Telehealth: Payer: Self-pay

## 2020-06-07 NOTE — Telephone Encounter (Signed)
Pt reports she missed appt due to work. Pt was reminded of the attendance policy and of her upcoming appt on 06/09/20 at 11am. Pt reports she is planning to attend this appt.

## 2020-06-09 ENCOUNTER — Ambulatory Visit: Payer: BC Managed Care – PPO | Admitting: Physical Therapy

## 2020-06-09 ENCOUNTER — Other Ambulatory Visit: Payer: Self-pay

## 2020-06-16 ENCOUNTER — Telehealth: Payer: Self-pay | Admitting: Physical Therapy

## 2020-06-16 ENCOUNTER — Ambulatory Visit: Payer: BC Managed Care – PPO | Admitting: Physical Therapy

## 2020-06-16 NOTE — Telephone Encounter (Signed)
Left voicemail via video interpreter [phone] for patient regarding cancel/no shows to physical therapy appointments. Left next appointment date and time. Also reminded patient of attendance policy and that her appointments will be canceled if she no-shows to next appointment.

## 2020-06-22 ENCOUNTER — Ambulatory Visit: Payer: BC Managed Care – PPO | Admitting: Physical Therapy

## 2020-06-27 ENCOUNTER — Encounter: Payer: BC Managed Care – PPO | Admitting: Physical Therapy

## 2020-07-04 ENCOUNTER — Ambulatory Visit: Payer: BC Managed Care – PPO

## 2020-07-28 ENCOUNTER — Encounter (INDEPENDENT_AMBULATORY_CARE_PROVIDER_SITE_OTHER): Payer: Self-pay | Admitting: Primary Care

## 2020-08-16 ENCOUNTER — Telehealth: Payer: Self-pay

## 2020-08-16 ENCOUNTER — Ambulatory Visit (INDEPENDENT_AMBULATORY_CARE_PROVIDER_SITE_OTHER): Payer: BC Managed Care – PPO | Admitting: Family Medicine

## 2020-08-16 NOTE — Patient Instructions (Incomplete)
Please continue using your CPAP regularly. While your insurance requires that you use CPAP at least 4 hours each night on 70% of the nights, I recommend, that you not skip any nights and use it throughout the night if you can. Getting used to CPAP and staying with the treatment long term does take time and patience and discipline. Untreated obstructive sleep apnea when it is moderate to severe can have an adverse impact on cardiovascular health and raise her risk for heart disease, arrhythmias, hypertension, congestive heart failure, stroke and diabetes. Untreated obstructive sleep apnea causes sleep disruption, nonrestorative sleep, and sleep deprivation. This can have an impact on your day to day functioning and cause daytime sleepiness and impairment of cognitive function, memory loss, mood disturbance, and problems focussing. Using CPAP regularly can improve these symptoms.     Sleep Apnea Sleep apnea affects breathing during sleep. It causes breathing to stop for a short time or to become shallow. It can also increase the risk of:  Heart attack.  Stroke.  Being very overweight (obese).  Diabetes.  Heart failure.  Irregular heartbeat. The goal of treatment is to help you breathe normally again. What are the causes? There are three kinds of sleep apnea:  Obstructive sleep apnea. This is caused by a blocked or collapsed airway.  Central sleep apnea. This happens when the brain does not send the right signals to the muscles that control breathing.  Mixed sleep apnea. This is a combination of obstructive and central sleep apnea. The most common cause of this condition is a collapsed or blocked airway. This can happen if:  Your throat muscles are too relaxed.  Your tongue and tonsils are too large.  You are overweight.  Your airway is too small.   What increases the risk?  Being overweight.  Smoking.  Having a small airway.  Being older.  Being female.  Drinking  alcohol.  Taking medicines to calm yourself (sedatives or tranquilizers).  Having family members with the condition. What are the signs or symptoms?  Trouble staying asleep.  Being sleepy or tired during the day.  Getting angry a lot.  Loud snoring.  Headaches in the morning.  Not being able to focus your mind (concentrate).  Forgetting things.  Less interest in sex.  Mood swings.  Personality changes.  Feelings of sadness (depression).  Waking up a lot during the night to pee (urinate).  Dry mouth.  Sore throat. How is this diagnosed?  Your medical history.  A physical exam.  A test that is done when you are sleeping (sleep study). The test is most often done in a sleep lab but may also be done at home. How is this treated?  Sleeping on your side.  Using a medicine to get rid of mucus in your nose (decongestant).  Avoiding the use of alcohol, medicines to help you relax, or certain pain medicines (narcotics).  Losing weight, if needed.  Changing your diet.  Not smoking.  Using a machine to open your airway while you sleep, such as: ? An oral appliance. This is a mouthpiece that shifts your lower jaw forward. ? A CPAP device. This device blows air through a mask when you breathe out (exhale). ? An EPAP device. This has valves that you put in each nostril. ? A BPAP device. This device blows air through a mask when you breathe in (inhale) and breathe out.  Having surgery if other treatments do not work. It is important to get treatment   for sleep apnea. Without treatment, it can lead to:  High blood pressure.  Coronary artery disease.  In men, not being able to have an erection (impotence).  Reduced thinking ability.   Follow these instructions at home: Lifestyle  Make changes that your doctor recommends.  Eat a healthy diet.  Lose weight if needed.  Avoid alcohol, medicines to help you relax, and some pain medicines.  Do not use any  products that contain nicotine or tobacco, such as cigarettes, e-cigarettes, and chewing tobacco. If you need help quitting, ask your doctor. General instructions  Take over-the-counter and prescription medicines only as told by your doctor.  If you were given a machine to use while you sleep, use it only as told by your doctor.  If you are having surgery, make sure to tell your doctor you have sleep apnea. You may need to bring your device with you.  Keep all follow-up visits as told by your doctor. This is important. Contact a doctor if:  The machine that you were given to use during sleep bothers you or does not seem to be working.  You do not get better.  You get worse. Get help right away if:  Your chest hurts.  You have trouble breathing in enough air.  You have an uncomfortable feeling in your back, arms, or stomach.  You have trouble talking.  One side of your body feels weak.  A part of your face is hanging down. These symptoms may be an emergency. Do not wait to see if the symptoms will go away. Get medical help right away. Call your local emergency services (911 in the [place] Do not drive yourself to the hospital. Summary  This condition affects breathing during sleep.  The most common cause is a collapsed or blocked airway.  The goal of treatment is to help you breathe normally while you sleep. This information is not intended to replace advice given to you by your health care provider. Make sure you discuss any questions you have with your health care provider. Document Revised: 04/03/2018 Document Reviewed: 02/10/2018 Elsevier Patient Education  2021 Elsevier Inc.  

## 2020-08-16 NOTE — Progress Notes (Deleted)
PATIENT: Kathryn Sanders DOB: 25-Jul-1975  REASON FOR VISIT: follow up HISTORY FROM: patient  No chief complaint on file.    HISTORY OF PRESENT ILLNESS: 08/16/20 ALL:  Kathryn Sanders is a 45 y.o. female here today for follow up for OSA on CPAP.  HST on 05/03/2020 showed severe OSA with total AHI of 42.4/hr and O2 nadir of 84%.   Compliance report dated    HISTORY: (copied from Dr Kathryn Sanders previous note)  Dear Kathryn Sanders,   I saw your patient, Kathryn Sanders, upon your kind request in my sleep clinic today for initial consultation of her sleep disorder, in particular, concern for underlying obstructive sleep apnea.  The patient is accompanied by a Spanish interpreter today. As you know, Ms. Kathryn Sanders is a 45 year old right-handed woman with an underlying medical history of Covid related pneumonia in August 2021 and mild obesity, who reports snoring and excessive daytime somnolence as well as recurrent headaches.  I reviewed your office note from 03/07/2020.  She had a recent appointment with pulmonology.  Her Epworth sleepiness score is 13/24, fatigue severity score is 61 out of 63.  She lives with her daughter, son-in-law and 34-year-old granddaughter.  She is separated.  Her daughter has sleep apnea and has a CPAP machine.  Patient would be willing to consider a CPAP machine.  She does report waking up with a sense of gasping at times and snoring.  Sometimes she has shortness of breath at night.  She uses an inhaler as needed.  She goes to bed around 8 or 9 and rise time is between 7 and 8 typically if she does not have to work the next day.  On Saturdays, Sundays and Mondays she has to get up at 4 AM.  She works in [employer].  She works from 6 AM to 6:30 PM, 3 days a week.  She is a non-smoker and does not drink alcohol and no caffeine on a daily basis.  She has nocturia about 3 times per average night and has woken up with a headache.  She does not typically take anything for her headaches, no  over-the-counter medication.    REVIEW OF SYSTEMS: Out of a complete 14 system review of symptoms, the patient complains only of the following symptoms, and all other reviewed systems are negative.    ALLERGIES: No Known Allergies  HOME MEDICATIONS: Outpatient Medications Prior to Visit  Medication Sig Dispense Refill  . albuterol (VENTOLIN HFA) 108 (90 Base) MCG/ACT inhaler Inhale 2 puffs into the lungs every 4 (four) hours as needed for wheezing or shortness of breath. 8 g 1  . benzonatate (TESSALON) 100 MG capsule Take 1 capsule (100 mg total) by mouth every 8 (eight) hours. (Patient not taking: Reported on 05/04/2020) 21 capsule 0  . HYDROcodone-homatropine (HYCODAN) 5-1.5 MG/5ML syrup Take 5 mLs by mouth every 6 (six) hours as needed for cough. (Patient not taking: Reported on 05/04/2020) 120 mL 0  . ibuprofen (ADVIL) 600 MG tablet Take 1 tablet (600 mg total) by mouth every 8 (eight) hours as needed. (Patient not taking: Reported on 05/04/2020) 90 tablet 0  . ondansetron (ZOFRAN ODT) 4 MG disintegrating tablet Take 1 tablet (4 mg total) by mouth every 8 (eight) hours as needed for nausea or vomiting. (Patient not taking: Reported on 05/04/2020) 20 tablet 0  . polyethylene glycol powder (GLYCOLAX/MIRALAX) 17 GM/SCOOP powder Take 17 g by mouth 2 (two) times daily as needed. (Patient not taking: Reported on  05/04/2020) 3350 g 1   No facility-administered medications prior to visit.    PAST MEDICAL HISTORY: No past medical history on file.  PAST SURGICAL HISTORY: Past Surgical History:  Procedure Laterality Date  . ABDOMINAL SURGERY      FAMILY HISTORY: Family History  Problem Relation Age of Onset  . Healthy Mother   . Healthy Father     SOCIAL HISTORY: Social History   Socioeconomic History  . Marital status: Single    Spouse name: Not on file  . Number of children: Not on file  . Years of education: Not on file  . Highest education level: Not on file  Occupational  History  . Not on file  Tobacco Use  . Smoking status: Never Smoker  . Smokeless tobacco: Never Used  Substance and Sexual Activity  . Alcohol use: Never  . Drug use: Never  . Sexual activity: Not Currently  Other Topics Concern  . Not on file  Social History Narrative  . Not on file   Social Determinants of Health   Financial Resource Strain: Not on file  Food Insecurity: Not on file  Transportation Needs: Not on file  Physical Activity: Not on file  Stress: Not on file  Social Connections: Not on file  Intimate Partner Violence: Not on file     PHYSICAL EXAM  There were no vitals filed for this visit. There is no height or weight on file to calculate BMI.  Generalized: Well developed, in no acute distress  Cardiology: normal rate and rhythm, no murmur noted Respiratory: clear to auscultation bilaterally  Neurological examination  Mentation: Alert oriented to time, place, history taking. Follows all commands speech and language fluent Cranial nerve II-XII: Pupils were equal round reactive to light. Extraocular movements were full, visual field were full  Motor: The motor testing reveals 5 over 5 strength of all 4 extremities. Good symmetric motor tone is noted throughout.  Gait and station: Gait is normal.    DIAGNOSTIC DATA (LABS, IMAGING, TESTING) - I reviewed patient records, labs, notes, testing and imaging myself where available.  No flowsheet data found.   Lab Results  Component Value Date   WBC 4.7 03/07/2020   HGB 13.1 03/07/2020   HCT 40.2 03/07/2020   MCV 94 03/07/2020   PLT 298 03/07/2020      Component Value Date/Time   NA 143 03/07/2020 1344   K 4.2 03/07/2020 1344   CL 105 03/07/2020 1344   CO2 26 03/07/2020 1344   GLUCOSE 101 (H) 03/07/2020 1344   GLUCOSE 114 (H) 02/16/2020 1244   BUN 13 03/07/2020 1344   CREATININE 0.72 03/07/2020 1344   CALCIUM 8.9 03/07/2020 1344   PROT 6.6 03/07/2020 1344   ALBUMIN 3.9 03/07/2020 1344   AST 24  03/07/2020 1344   ALT 50 (H) 03/07/2020 1344   ALKPHOS 197 (H) 03/07/2020 1344   BILITOT 0.3 03/07/2020 1344   GFRNONAA 104 03/07/2020 1344   GFRAA 119 03/07/2020 1344   Lab Results  Component Value Date   CHOL 156 09/21/2019   HDL 62 09/21/2019   LDLCALC 79 09/21/2019   TRIG 48 02/14/2020   CHOLHDL 2.5 09/21/2019   No results found for: HGBA1C No results found for: VITAMINB12 No results found for: TSH   ASSESSMENT AND PLAN 45 y.o. year old female  has no past medical history on file. here with ***  No diagnosis found.   Simya Kynisha Memon is doing well on CPAP therapy. Compliance report  reveals ***. *** was encouraged to continue using CPAP nightly and for greater than 4 hours each night. We will update supply orders as indicated. Risks of untreated sleep apnea review and education materials provided. Healthy lifestyle habits encouraged. *** will follow up in ***, sooner if needed. *** verbalizes understanding and agreement with this plan.   No orders of the defined types were placed in this encounter.    No orders of the defined types were placed in this encounter.     I spent 15 minutes with the patient. 50% of this time was spent counseling and educating patient on plan of care and medications.    Shawnie Dapper, FNP-C 08/16/2020, 7:27 AM [facility] [address] [place], [place] [address] [phone]

## 2020-08-16 NOTE — Telephone Encounter (Signed)
Tried to contact the patient on February 15th in regards to her appointment today 08/16/20. She is in initial CPAP user, I called due to no data being found to ask her if she has received her CPAP yet. I left a voicemail asking her to give the office a call on 08/15/20, and another attempt 08/16/20, no answer.

## 2020-09-27 ENCOUNTER — Ambulatory Visit (INDEPENDENT_AMBULATORY_CARE_PROVIDER_SITE_OTHER): Payer: Self-pay | Admitting: *Deleted

## 2020-09-27 NOTE — Telephone Encounter (Signed)
Patient's daughter calling in for patient using interpretor line , Thomasenia Sales FG#[id number]. Patient's daughter reports patient has been having pain in right breast under areola x 3 months . "feels like a piece of skin" under areola. Denies fever, redness or skin peeling or discharge from nipple. Last mammogram was last year. appt scheduled for 10/05/20. Care advise given. Patient's daughter verbalized understanding of care advise and to call back or go to ED if symptoms worsen.   Reason for Disposition . [1] Breast pain AND [2] cause is not known  Answer Assessment - Initial Assessment Questions 1. SYMPTOM: "What's the main symptom you're concerned about?"  (e.g., lump, pain, rash, nipple discharge)     Right breast pain . Feels like a "piece of skin" under the right areola. 2. LOCATION: "Where is the pain located?"     Under the areola on right breast 3. ONSET: "When did pain start?"     3 months ago  4. PRIOR HISTORY: "Do you have any history of prior problems with your breasts?" (e.g., lumps, cancer, fibrocystic breast disease)     Na. Had a mammogram 1 year ago  5. CAUSE: "What do you think is causing this symptom?"     Not sure  6. OTHER SYMPTOMS: "Do you have any other symptoms?" (e.g., fever, breast pain, redness or rash, nipple discharge)     Right breast pain. 7. PREGNANCY-BREASTFEEDING: "Is there any chance you are pregnant?" "When was your last menstrual period?" "Are you breastfeeding?"     na  Protocols used: BREAST [facility]

## 2020-09-28 DIAGNOSIS — J0110 Acute frontal sinusitis, unspecified: Secondary | ICD-10-CM | POA: Diagnosis not present

## 2020-09-28 DIAGNOSIS — R509 Fever, unspecified: Secondary | ICD-10-CM | POA: Diagnosis not present

## 2020-09-28 DIAGNOSIS — Z20822 Contact with and (suspected) exposure to covid-19: Secondary | ICD-10-CM | POA: Diagnosis not present

## 2020-10-05 ENCOUNTER — Encounter (INDEPENDENT_AMBULATORY_CARE_PROVIDER_SITE_OTHER): Payer: Self-pay | Admitting: Primary Care

## 2020-10-05 ENCOUNTER — Ambulatory Visit (INDEPENDENT_AMBULATORY_CARE_PROVIDER_SITE_OTHER): Payer: Self-pay | Admitting: Primary Care

## 2020-10-05 ENCOUNTER — Other Ambulatory Visit: Payer: Self-pay

## 2020-10-05 VITALS — BP 110/66 | HR 65 | Temp 97.7°F | Ht 61.0 in | Wt 170.1 lb

## 2020-10-05 DIAGNOSIS — N644 Mastodynia: Secondary | ICD-10-CM

## 2020-10-05 DIAGNOSIS — N6459 Other signs and symptoms in breast: Secondary | ICD-10-CM

## 2020-10-05 DIAGNOSIS — K5900 Constipation, unspecified: Secondary | ICD-10-CM

## 2020-10-05 DIAGNOSIS — F32A Depression, unspecified: Secondary | ICD-10-CM

## 2020-10-05 MED ORDER — SENNA 8.6 MG PO TABS: 1.0000 | ORAL_TABLET | Freq: Every evening | ORAL | 0 refills | Status: DC | PRN

## 2020-10-05 NOTE — Progress Notes (Signed)
Established Patient Office Visit  Subjective:  Patient ID: Kathryn Sanders, female    DOB: 1975/11/25  Age: 45 y.o. MRN: [id number]  CC:  Chief Complaint  Patient presents with  . Breast Pain    HPI Kathryn Sanders is a 45 year old Hispanic female (interputor [employer] [phone]) presents for breast pain. She states sometimes when she is bathing in the shower mass right breast 4 o clock it is there for 4 month with pain than she is unable to feel it.   No past medical history on file.  Past Surgical History:  Procedure Laterality Date  . ABDOMINAL SURGERY      Family History  Problem Relation Age of Onset  . Healthy Mother   . Healthy Father     Social History   Socioeconomic History  . Marital status: Single    Spouse name: Not on file  . Number of children: Not on file  . Years of education: Not on file  . Highest education level: Not on file  Occupational History  . Not on file  Tobacco Use  . Smoking status: Never Smoker  . Smokeless tobacco: Never Used  Substance and Sexual Activity  . Alcohol use: Never  . Drug use: Never  . Sexual activity: Not Currently  Other Topics Concern  . Not on file  Social History Narrative  . Not on file   Social Determinants of Health   Financial Resource Strain: Not on file  Food Insecurity: Not on file  Transportation Needs: Not on file  Physical Activity: Not on file  Stress: Not on file  Social Connections: Not on file  Intimate Partner Violence: Not on file    Outpatient Medications Prior to Visit  Medication Sig Dispense Refill  . albuterol (VENTOLIN HFA) 108 (90 Base) MCG/ACT inhaler Inhale 2 puffs into the lungs every 4 (four) hours as needed for wheezing or shortness of breath. 8 g 1  . benzonatate (TESSALON) 100 MG capsule Take 1 capsule (100 mg total) by mouth every 8 (eight) hours. (Patient not taking: No sig reported) 21 capsule 0  . HYDROcodone-homatropine (HYCODAN) 5-1.5 MG/5ML syrup Take 5 mLs by  mouth every 6 (six) hours as needed for cough. (Patient not taking: No sig reported) 120 mL 0  . ibuprofen (ADVIL) 600 MG tablet Take 1 tablet (600 mg total) by mouth every 8 (eight) hours as needed. (Patient not taking: No sig reported) 90 tablet 0  . ondansetron (ZOFRAN ODT) 4 MG disintegrating tablet Take 1 tablet (4 mg total) by mouth every 8 (eight) hours as needed for nausea or vomiting. (Patient not taking: No sig reported) 20 tablet 0  . polyethylene glycol powder (GLYCOLAX/MIRALAX) 17 GM/SCOOP powder Take 17 g by mouth 2 (two) times daily as needed. (Patient not taking: No sig reported) 3350 g 1   No facility-administered medications prior to visit.    No Known Allergies  ROS Review of Systems  Gastrointestinal: Positive for constipation.  Neurological: Positive for headaches.       3 times weekly pain increase with coke or coffee stop caffeine   Psychiatric/Behavioral:       Depression   All other systems reviewed and are negative.     Objective:    Physical Exam Vitals reviewed. Exam conducted with a chaperone present.  Constitutional:      Appearance: She is obese.  HENT:     Head: Normocephalic.     Right Ear: Tympanic membrane normal.  Left Ear: Tympanic membrane normal.     Nose: Nose normal.  Eyes:     Extraocular Movements: Extraocular movements intact.     Pupils: Pupils are equal, round, and reactive to light.  Cardiovascular:     Rate and Rhythm: Normal rate and regular rhythm.  Pulmonary:     Effort: Pulmonary effort is normal.     Breath sounds: Normal breath sounds.  Chest:     Chest wall: Mass present.  Breasts:     Right: Inverted nipple and mass present.     Left: Inverted nipple present.     Abdominal:     General: Bowel sounds are normal.     Palpations: Abdomen is soft.  Musculoskeletal:        General: Normal range of motion.     Cervical back: Normal range of motion and neck supple.  Skin:    General: Skin is warm and dry.   Neurological:     Mental Status: She is alert and oriented to person, place, and time.  Psychiatric:        Mood and Affect: Mood normal.        Behavior: Behavior normal.        Thought Content: Thought content normal.        Judgment: Judgment normal.     BP 110/66   Pulse 65   Temp 97.7 F (36.5 C) (Oral)   Ht 5\' 1"  (1.549 m)   Wt 170 lb 1 oz (77.1 kg)   SpO2 96%   BMI 32.13 kg/m  Wt Readings from Last 3 Encounters:  10/05/20 170 lb 1 oz (77.1 kg)  05/04/20 165 lb (74.8 kg)  04/12/20 165 lb 5 oz (75 kg)     Health Maintenance Due  Topic Date Due  . COVID-19 Vaccine (1) Never done    There are no preventive care reminders to display for this patient.  No results found for: TSH Lab Results  Component Value Date   WBC 4.7 03/07/2020   HGB 13.1 03/07/2020   HCT 40.2 03/07/2020   MCV 94 03/07/2020   PLT 298 03/07/2020   Lab Results  Component Value Date   NA 143 03/07/2020   K 4.2 03/07/2020   CO2 26 03/07/2020   GLUCOSE 101 (H) 03/07/2020   BUN 13 03/07/2020   CREATININE 0.72 03/07/2020   BILITOT 0.3 03/07/2020   ALKPHOS 197 (H) 03/07/2020   AST 24 03/07/2020   ALT 50 (H) 03/07/2020   PROT 6.6 03/07/2020   ALBUMIN 3.9 03/07/2020   CALCIUM 8.9 03/07/2020   ANIONGAP 10 02/16/2020   Lab Results  Component Value Date   CHOL 156 09/21/2019   Lab Results  Component Value Date   HDL 62 09/21/2019   Lab Results  Component Value Date   LDLCALC 79 09/21/2019   Lab Results  Component Value Date   TRIG 48 02/14/2020   Lab Results  Component Value Date   CHOLHDL 2.5 09/21/2019   No results found for: HGBA1C    Assessment & Plan:  Jackson was seen today for breast pain.  Diagnoses and all orders for this visit:  Breast pain Recommended 400 mg IU x2 total of 800 mg of vitamin E daily.  Reassured has shown improvement with breast pain and tenderness  Depression, unspecified depression type Flowsheet Row Office Visit from 10/05/2020 in [facility]  [facility]  PHQ-9 Total Score 17    Depression can affect your thoughts and feelings,  relationships, daily activities, and physical health. It is caused by changes in the way your brain functions. If you receive a diagnosis of depression, your health care provider will tell you which type of depression you have and what treatment options are available to you.Patient prefers to speak with a therapist  Referred to psychotherapy  Abnormal breast exam Cyst felt right breast 3:00 near the nipple.  Also, had patient to also feel area.  Given Patient to completed application for [facility] while in clinic and application has and faxed to [facility]. Patient aware that [facility] will contact her directly to schedule appointment.  Constipation Discussed drinking 64 ounces of water daily increasing fiber fruits and vegetables especially green leafy.  Sent in a stool softener and laxative Senokot.  Fiber can also be obtained with Metamucil if she desires to try it.  Follow-up: Return if symptoms worsen or fail to improve.    [name], NP

## 2020-10-05 NOTE — Progress Notes (Signed)
Having pain in right breast. Pain on right side when heavy lifting.

## 2020-10-14 ENCOUNTER — Encounter: Payer: Self-pay | Admitting: Neurology

## 2020-11-21 ENCOUNTER — Encounter: Payer: Self-pay | Admitting: Primary Care

## 2020-11-21 ENCOUNTER — Ambulatory Visit: Payer: BC Managed Care – PPO | Admitting: Neurology

## 2020-11-21 ENCOUNTER — Encounter: Payer: Self-pay | Admitting: Neurology

## 2020-11-21 VITALS — BP 142/79 | HR 46 | Ht 61.0 in | Wt 168.0 lb

## 2020-11-21 DIAGNOSIS — Z9989 Dependence on other enabling machines and devices: Secondary | ICD-10-CM

## 2020-11-21 DIAGNOSIS — Z803 Family history of malignant neoplasm of breast: Secondary | ICD-10-CM | POA: Diagnosis not present

## 2020-11-21 DIAGNOSIS — G4733 Obstructive sleep apnea (adult) (pediatric): Secondary | ICD-10-CM | POA: Diagnosis not present

## 2020-11-21 DIAGNOSIS — Z1231 Encounter for screening mammogram for malignant neoplasm of breast: Secondary | ICD-10-CM | POA: Diagnosis not present

## 2020-11-21 NOTE — Patient Instructions (Signed)
It was good to see you again today.  I am glad to hear that you are adjusting to your AutoPap machine but you have not been able to consistently use it yet.  You have noted some benefit, hopefully, when you are able to use your machine consistently, you will benefit more consistently from it with reduction in your headaches and better daytime energy.  For shortness of breath during the day and intermittent chest pain, please make a follow-up appointment with your primary care nurse practitioner or physician.   By insurance criteria you are not quite yet compliant with your AutoPap machine.  You have about a little over 3 weeks to show compliance by insurance mandate.  You need to use your machine all night, every night from now on and we should try to get a report in a month from now.  You can bring the chip or scan the code onto your cell phone and try to give [place] access remotely or you can take the machine for a download to your DME company.  Please follow-up to see one of our nurse practitioners in about 3 to 4 months.  Call us if you have any interim questions or concerns.  Please continue using your autoPAP regularly. While your insurance requires that you use PAP at least 4 hours each night on 70% of the nights, I recommend, that you not skip any nights and use it throughout the night if you can. Getting used to PAP and staying with the treatment long term does take time and patience and discipline. Untreated obstructive sleep apnea when it is moderate to severe can have an adverse impact on cardiovascular health and raise her risk for heart disease, arrhythmias, hypertension, congestive heart failure, stroke and diabetes. Untreated obstructive sleep apnea causes sleep disruption, nonrestorative sleep, and sleep deprivation. This can have an impact on your day to day functioning and cause daytime sleepiness and impairment of cognitive function, memory loss, mood disturbance, and problems focussing. Using  PAP regularly can improve these symptoms.

## 2020-11-21 NOTE — Progress Notes (Signed)
Subjective:    Patient ID: Kathryn Sanders is a 45 y.o. female.  HPI     Interim history:   Ms. Kathryn Sanders is a 45 year old right-handed woman with an underlying medical history of Covid related pneumonia in August 2021 and mild obesity, who presents for annual follow-up consultation of her obstructive sleep apnea after interim testing and starting AutoPap therapy.  The patient is accompanied by a Spanish interpreter today.  I first met her at the request of nurse practitioner Lazaro Arms with the [place] [facility], at which time the patient reported snoring and excessive daytime somnolence as well as recurrent headaches.  She was advised to proceed with a sleep study.  She had a home sleep test on 05/03/2020 which indicated severe obstructive sleep apnea with an AHI of 42.4/h, O2 nadir 85%.  She was advised to start AutoPap therapy at home, set up date was 09/15/2020; she has a Personnel officer.  Today, 11/21/2020: We did a card DL, I reviewed her AutoPap compliance data for the past 30 days from 10/22/2020 through 11/20/2020, during which time she used her machine only 11 days with percent use days greater than 4 hours at only 26.7%, indicating low compliance with an average usage for days on treatment of 4 hours and 56 minutes.  Residual AHI at 1.1/h, average leak on the higher side.  Pressure range of 7-13, 95th percentile of pressure of 8.4 cm.  Her compliance was slightly better but still suboptimal in the beginning between 09/15/2020 and 10/14/2020 she used her machine 13 out of 30 days with percent use days greater than 4 hours at 33.3%.  She reports that she is still adjusting to the treatment.  She admits that she had lapses in treatment because of a trip to [place] and she also often stays at her daughter's house and does not take the machine with her, she did not take it in [place] either.  She is motivated to continue with treatment and is willing to be consistent with it.  She has benefited in fact from  treatment, and that her daytime symptoms are better, her headaches have improved and she feels that her daytime energy is better, less sleepiness.  Sometimes she sleeps well at night and sometimes she still has problems at night.  She also occasionally has mid sternal chest pain and occasional shortness of breath during the day.  She has not seen her primary care for this.  She uses a full facemask.  We talked about her home sleep test results and reviewed her compliance data in detail and we also talked about the insurance requirement for compliance and the timeline for this.    The patient's allergies, current medications, family history, past medical history, past social history, past surgical history and problem list were reviewed and updated as appropriate.    Previously:   04/12/20: (She) reports snoring and excessive daytime somnolence as well as recurrent headaches.  I reviewed your office note from 03/07/2020.  She had a recent appointment with pulmonology.  Her Epworth sleepiness score is 13/24, fatigue severity score is 61 out of 63.  She lives with her daughter, son-in-law and 90-year-old granddaughter.  She is separated.  Her daughter has sleep apnea and has a CPAP machine.  Patient would be willing to consider a CPAP machine.  She does report waking up with a sense of gasping at times and snoring.  Sometimes she has shortness of breath at night.  She uses an inhaler as needed.  She goes to bed around 8 or 9 and rise time is between 7 and 8 typically if she does not have to work the next day.  On Saturdays, Sundays and Mondays she has to get up at 4 AM.  She works in Psychologist, counselling.  She works from 6 AM to 6:30 PM, 3 days a week.  She is a non-smoker and does not drink alcohol and no caffeine on a daily basis.  She has nocturia about 3 times per average night and has woken up with a headache.  She does not typically take anything for her headaches, no over-the-counter medication.  Her Past  Medical History Is Significant For: History reviewed. No pertinent past medical history.  Her Past Surgical History Is Significant For: Past Surgical History:  Procedure Laterality Date  . ABDOMINAL SURGERY      Her Family History Is Significant For: Family History  Problem Relation Age of Onset  . Healthy Mother   . Healthy Father     Her Social History Is Significant For: Social History   Socioeconomic History  . Marital status: Single    Spouse name: Not on file  . Number of children: Not on file  . Years of education: Not on file  . Highest education level: Not on file  Occupational History  . Not on file  Tobacco Use  . Smoking status: Never Smoker  . Smokeless tobacco: Never Used  Substance and Sexual Activity  . Alcohol use: Never  . Drug use: Never  . Sexual activity: Not Currently  Other Topics Concern  . Not on file  Social History Narrative  . Not on file   Social Determinants of Health   Financial Resource Strain: Not on file  Food Insecurity: Not on file  Transportation Needs: Not on file  Physical Activity: Not on file  Stress: Not on file  Social Connections: Not on file    Her Allergies Are:  No Known Allergies:   Her Current Medications Are:  Outpatient Encounter Medications as of 11/21/2020  Medication Sig  . albuterol (VENTOLIN HFA) 108 (90 Base) MCG/ACT inhaler Inhale 2 puffs into the lungs every 4 (four) hours as needed for wheezing or shortness of breath.  . benzonatate (TESSALON) 100 MG capsule Take 1 capsule (100 mg total) by mouth every 8 (eight) hours. (Patient not taking: No sig reported)  . HYDROcodone-homatropine (HYCODAN) 5-1.5 MG/5ML syrup Take 5 mLs by mouth every 6 (six) hours as needed for cough. (Patient not taking: No sig reported)  . ibuprofen (ADVIL) 600 MG tablet Take 1 tablet (600 mg total) by mouth every 8 (eight) hours as needed. (Patient not taking: No sig reported)  . ondansetron (ZOFRAN ODT) 4 MG disintegrating  tablet Take 1 tablet (4 mg total) by mouth every 8 (eight) hours as needed for nausea or vomiting. (Patient not taking: No sig reported)  . polyethylene glycol powder (GLYCOLAX/MIRALAX) 17 GM/SCOOP powder Take 17 g by mouth 2 (two) times daily as needed. (Patient not taking: No sig reported)  . senna (SENOKOT) 8.6 MG TABS tablet Take 1 tablet (8.6 mg total) by mouth at bedtime as needed for mild constipation.   No facility-administered encounter medications on file as of 11/21/2020.  :  Review of Systems:  Out of a complete 14 point review of systems, all are reviewed and negative with the exception of these symptoms as listed below:  Review of Systems  Neurological:       Here for f/u on  autopap. Reports things are going ok. Does not use it every night. Some nights she stays with her daughter and will not use it on those nights.    Objective:  Neurological Exam  Physical Exam Physical Examination:   Vitals:   11/21/20 1304  BP: (!) 142/79  Pulse: (!) 46    General Examination: The patient is a very pleasant 45 y.o. female in no acute distress. She appears well-developed and well-nourished and well groomed.   HEENT: Normocephalic, atraumatic, pupils are equal, round and reactive to light, extraocular tracking is good without limitation to gaze excursion or nystagmus noted. Hearing is grossly intact. Face is symmetric with normal facial animation. Speech is clear with no dysarthria noted. There is no hypophonia. There is no lip, neck/head, jaw or voice tremor. Neck is supple with full range of passive and active motion. There are no carotid bruits on auscultation. Oropharynx exam reveals: mild mouth dryness, adequate dental hygiene and moderate airway crowding.  Tongue protrudes centrally and palate elevates symmetrically.  Chest: Clear to auscultation without wheezing, rhonchi or crackles noted.  Heart: S1+S2+0, regular and normal without murmurs, rubs or gallops noted.  Mild  bradycardia noted.  Abdomen: Soft, non-tender and non-distended.  Extremities: There is no pitting edema in the distal lower extremities bilaterally.   Skin: Warm and dry without trophic changes noted.   Musculoskeletal: exam reveals no obvious joint deformities, tenderness or joint swelling or erythema.   Neurologically:  Mental status: The patient is awake, alert and oriented in all 4 spheres. Her immediate and remote memory, attention, language skills and fund of knowledge are appropriate. There is no evidence of aphasia, agnosia, apraxia or anomia. Speech is clear with normal prosody and enunciation. Thought process is linear. Mood is normal and affect is normal.  Cranial nerves II - XII are as described above under HEENT exam.  Motor exam: Normal bulk, strength and tone is noted. There is no tremor, Romberg is negative. Fine motor skills and coordination: grossly intact.  Cerebellar testing: No dysmetria or intention tremor. There is no truncal or gait ataxia.  Sensory exam: intact to light touch in the upper and lower extremities.  Gait, station and balance: She stands easily. No veering to one side is noted. No leaning to one side is noted. Posture is age-appropriate and stance is narrow based. Gait shows normal stride length and normal pace. No problems turning are noted.  Tandem walk unremarkable.  Assessment and Plan:  In summary, Emogene Shailene Demonbreun is a very pleasant 45 year old female with an underlying medical history of Covid related pneumonia in August 2021 and mild obesity, who presents for follow-up consultation of her obstructive sleep apnea, which was deemed in the severe range by home sleep testing on 05/03/2020.  She like started AutoPap therapy in March 2022 Is not quite yet fully compliant.  She is working on treatment adherence.  She has benefited from treatment and that her daytime sleepiness and recurrent headaches have improved.  She is motivated to continue with  treatment and understands the importance of compliance not just for insurance purposes but also to reap more inconsistent benefit from treatment.  She is advised to stay fully compliant with treatment and also take her machine when she leaves town or goes to sleep at her daughter's house.  It is important to be able to maintain treatment, in fact, she has already benefited to some degree from AutoPap therapy and her apnea scores are at goal.  She is  using a full facemask with good tolerance.  She is advised to follow-up routinely in this clinic to see one of our nurse practitioners in about 3 months and we will try to get a download in about a month, we can try to get a remote download or chip download or ask her DME company to send [place] her 30-day report in a month.  She is agreeable to this approach.  I answered all her questions today and she was in agreement with the plan.  We talked about her sleep test results in detail and also reviewed her compliance data in detail today. I spent 31 minutes in total face-to-face time and in reviewing records during pre-charting, more than 50% of which was spent in counseling and coordination of care, reviewing test results, reviewing medications and treatment regimen and/or in discussing or reviewing the diagnosis of OSA, the prognosis and treatment options. Pertinent laboratory and imaging test results that were available during this visit with the patient were reviewed by me and considered in my medical decision making (see chart for details).

## 2020-12-11 ENCOUNTER — Encounter: Payer: Self-pay | Admitting: Neurology

## 2020-12-12 ENCOUNTER — Telehealth: Payer: Self-pay

## 2020-12-12 NOTE — Telephone Encounter (Signed)
Please call patient and advise him that his brain MRI I reviewed patient's AutoPap compliance data from 11/12/2020 through 12/11/2020, which is a total of 30 days, during which time she used her machine 21 out of 30 days.  With percent use days greater than 4 hours at 53.3%, indicating mildly suboptimal compliance.  Average AHI is good and at goal at 0.6/h, leak is on the higher side.  Please advise patient to be consistent with her CPAP, she has done a great job since her clinic visit on 11/21/2020 and has indeed started using her machine every night since then but she has - when we look at the total month - not quite reached insurance criteria for compliance.  She has improved her usage and her percent use days greater than 4 hours has increased significantly since her visit.  Please encourage her to be consistent with her usage and follow-up as scheduled.

## 2020-12-12 NOTE — Telephone Encounter (Signed)
Pt came by the office today for DL for CPAP. DL completed and compliance data placed in MD's office for review.

## 2020-12-13 NOTE — Telephone Encounter (Signed)
See my chart message from 12/13/20.

## 2021-02-16 DIAGNOSIS — R7401 Elevation of levels of liver transaminase levels: Secondary | ICD-10-CM | POA: Diagnosis not present

## 2021-02-16 DIAGNOSIS — R509 Fever, unspecified: Secondary | ICD-10-CM | POA: Diagnosis not present

## 2021-02-16 DIAGNOSIS — U071 COVID-19: Secondary | ICD-10-CM | POA: Diagnosis not present

## 2021-02-16 DIAGNOSIS — R112 Nausea with vomiting, unspecified: Secondary | ICD-10-CM | POA: Diagnosis not present

## 2021-02-16 DIAGNOSIS — R0602 Shortness of breath: Secondary | ICD-10-CM | POA: Diagnosis not present

## 2021-02-19 ENCOUNTER — Ambulatory Visit: Payer: BC Managed Care – PPO | Admitting: Family Medicine

## 2021-02-27 ENCOUNTER — Telehealth: Payer: Self-pay

## 2021-02-27 NOTE — Telephone Encounter (Signed)
Called and lvm for pt to bring in CPAP and power cord to appt 02/28/21 for a manual DL

## 2021-02-28 ENCOUNTER — Ambulatory Visit: Payer: BC Managed Care – PPO | Admitting: Family Medicine

## 2021-02-28 NOTE — Progress Notes (Deleted)
PATIENT: Kathryn Sanders DOB: Jan 25, 1976  REASON FOR VISIT: follow up HISTORY FROM: patient  No chief complaint on file.    HISTORY OF PRESENT ILLNESS: 02/28/21 ALL:  Kathryn Sanders is a 45 y.o. female here today for follow up for OSA on CPAP.      HISTORY: (copied from Dr Guadelupe Sabin previous note)  Ms. Kathryn Sanders is a 45 year old right-handed woman with an underlying medical history of Covid related pneumonia in August 2021 and mild obesity, who presents for annual follow-up consultation of her obstructive sleep apnea after interim testing and starting AutoPap therapy.  The patient is accompanied by a Spanish interpreter today.  I first met her at the request of nurse practitioner Lazaro Arms with the [facility], at which time the patient reported snoring and excessive daytime somnolence as well as recurrent headaches.  She was advised to proceed with a sleep study.  She had a home sleep test on 05/03/2020 which indicated severe obstructive sleep apnea with an AHI of 42.4/h, O2 nadir 85%.  She was advised to start AutoPap therapy at home, set up date was 09/15/2020; she has a Personnel officer.   Today, 11/21/2020: We did a card DL, I reviewed her AutoPap compliance data for the past 30 days from 10/22/2020 through 11/20/2020, during which time she used her machine only 11 days with percent use days greater than 4 hours at only 26.7%, indicating low compliance with an average usage for days on treatment of 4 hours and 56 minutes.  Residual AHI at 1.1/h, average leak on the higher side.  Pressure range of 7-13, 95th percentile of pressure of 8.4 cm.  Her compliance was slightly better but still suboptimal in the beginning between 09/15/2020 and 10/14/2020 she used her machine 13 out of 30 days with percent use days greater than 4 hours at 33.3%.  She reports that she is still adjusting to the treatment.  She admits that she had lapses in treatment because of a trip to [place] and she also often stays at her  daughter's house and does not take the machine with her, she did not take it in [place] either.  She is motivated to continue with treatment and is willing to be consistent with it.  She has benefited in fact from treatment, and that her daytime symptoms are better, her headaches have improved and she feels that her daytime energy is better, less sleepiness.  Sometimes she sleeps well at night and sometimes she still has problems at night.  She also occasionally has mid sternal chest pain and occasional shortness of breath during the day.  She has not seen her primary care for this.  She uses a full facemask.  We talked about her home sleep test results and reviewed her compliance data in detail and we also talked about the insurance requirement for compliance and the timeline for this.     The patient's allergies, current medications, family history, past medical history, past social history, past surgical history and problem list were reviewed and updated as appropriate.      REVIEW OF SYSTEMS: Out of a complete 14 system review of symptoms, the patient complains only of the following symptoms, and all other reviewed systems are negative.  ESS: FSS:  ALLERGIES: No Known Allergies  HOME MEDICATIONS: Outpatient Medications Prior to Visit  Medication Sig Dispense Refill   albuterol (VENTOLIN HFA) 108 (90 Base) MCG/ACT inhaler Inhale 2 puffs into the lungs every 4 (four) hours as needed  for wheezing or shortness of breath. 8 g 1   benzonatate (TESSALON) 100 MG capsule Take 1 capsule (100 mg total) by mouth every 8 (eight) hours. (Patient not taking: No sig reported) 21 capsule 0   HYDROcodone-homatropine (HYCODAN) 5-1.5 MG/5ML syrup Take 5 mLs by mouth every 6 (six) hours as needed for cough. (Patient not taking: No sig reported) 120 mL 0   ibuprofen (ADVIL) 600 MG tablet Take 1 tablet (600 mg total) by mouth every 8 (eight) hours as needed. (Patient not taking: No sig reported) 90 tablet 0    ondansetron (ZOFRAN ODT) 4 MG disintegrating tablet Take 1 tablet (4 mg total) by mouth every 8 (eight) hours as needed for nausea or vomiting. (Patient not taking: No sig reported) 20 tablet 0   polyethylene glycol powder (GLYCOLAX/MIRALAX) 17 GM/SCOOP powder Take 17 g by mouth 2 (two) times daily as needed. (Patient not taking: No sig reported) 3350 g 1   senna (SENOKOT) 8.6 MG TABS tablet Take 1 tablet (8.6 mg total) by mouth at bedtime as needed for mild constipation. 120 tablet 0   No facility-administered medications prior to visit.    PAST MEDICAL HISTORY: No past medical history on file.  PAST SURGICAL HISTORY: Past Surgical History:  Procedure Laterality Date   ABDOMINAL SURGERY      FAMILY HISTORY: Family History  Problem Relation Age of Onset   Healthy Mother    Healthy Father     SOCIAL HISTORY: Social History   Socioeconomic History   Marital status: Single    Spouse name: Not on file   Number of children: Not on file   Years of education: Not on file   Highest education level: Not on file  Occupational History   Not on file  Tobacco Use   Smoking status: Never   Smokeless tobacco: Never  Substance and Sexual Activity   Alcohol use: Never   Drug use: Never   Sexual activity: Not Currently  Other Topics Concern   Not on file  Social History Narrative   Not on file   Social Determinants of Health   Financial Resource Strain: Not on file  Food Insecurity: Not on file  Transportation Needs: Not on file  Physical Activity: Not on file  Stress: Not on file  Social Connections: Not on file  Intimate Partner Violence: Not on file     PHYSICAL EXAM  There were no vitals filed for this visit. There is no height or weight on file to calculate BMI.  Generalized: Well developed, in no acute distress  Cardiology: normal rate and rhythm, no murmur noted Respiratory: clear to auscultation bilaterally  Neurological examination  Mentation: Alert oriented  to time, place, history taking. Follows all commands speech and language fluent Cranial nerve II-XII: Pupils were equal round reactive to light. Extraocular movements were full, visual field were full  Motor: The motor testing reveals 5 over 5 strength of all 4 extremities. Good symmetric motor tone is noted throughout.  Gait and station: Gait is normal.    DIAGNOSTIC DATA (LABS, IMAGING, TESTING) - I reviewed patient records, labs, notes, testing and imaging myself where available.  No flowsheet data found.   Lab Results  Component Value Date   WBC 4.7 03/07/2020   HGB 13.1 03/07/2020   HCT 40.2 03/07/2020   MCV 94 03/07/2020   PLT 298 03/07/2020      Component Value Date/Time   NA 143 03/07/2020 1344   K 4.2 03/07/2020 1344  CL 105 03/07/2020 1344   CO2 26 03/07/2020 1344   GLUCOSE 101 (H) 03/07/2020 1344   GLUCOSE 114 (H) 02/16/2020 1244   BUN 13 03/07/2020 1344   CREATININE 0.72 03/07/2020 1344   CALCIUM 8.9 03/07/2020 1344   PROT 6.6 03/07/2020 1344   ALBUMIN 3.9 03/07/2020 1344   AST 24 03/07/2020 1344   ALT 50 (H) 03/07/2020 1344   ALKPHOS 197 (H) 03/07/2020 1344   BILITOT 0.3 03/07/2020 1344   GFRNONAA 104 03/07/2020 1344   GFRAA 119 03/07/2020 1344   Lab Results  Component Value Date   CHOL 156 09/21/2019   HDL 62 09/21/2019   LDLCALC 79 09/21/2019   TRIG 48 02/14/2020   CHOLHDL 2.5 09/21/2019   No results found for: HGBA1C No results found for: VITAMINB12 No results found for: TSH   ASSESSMENT AND PLAN 45 y.o. year old female  has no past medical history on file. here with   No diagnosis found.    Teonia Luz Mares is doing well on CPAP therapy. Compliance report reveals ***. *** was encouraged to continue using CPAP nightly and for greater than 4 hours each night. We will update supply orders as indicated. Risks of untreated sleep apnea review and education materials provided. Healthy lifestyle habits encouraged. *** will follow up in ***, sooner if  needed. *** verbalizes understanding and agreement with this plan.    No orders of the defined types were placed in this encounter.    No orders of the defined types were placed in this encounter.     Debbora Presto, FNP-C 02/28/2021, 7:12 AM [facility] [address] [place], [place] [address] [phone]

## 2021-03-18 DIAGNOSIS — G4733 Obstructive sleep apnea (adult) (pediatric): Secondary | ICD-10-CM | POA: Diagnosis not present

## 2021-04-17 DIAGNOSIS — G4733 Obstructive sleep apnea (adult) (pediatric): Secondary | ICD-10-CM | POA: Diagnosis not present

## 2021-04-27 DIAGNOSIS — G4733 Obstructive sleep apnea (adult) (pediatric): Secondary | ICD-10-CM | POA: Diagnosis not present

## 2021-05-28 DIAGNOSIS — G4733 Obstructive sleep apnea (adult) (pediatric): Secondary | ICD-10-CM | POA: Diagnosis not present

## 2021-06-04 NOTE — Progress Notes (Signed)
PATIENT: Kathryn Sanders DOB: June 30, 1976  REASON FOR VISIT: follow up HISTORY FROM: patient  Chief Complaint  Patient presents with   Obstructive Sleep Apnea    Rm 16, w [facility] interpreter. Here for initial CPAP f/u. Pt initially was having issues with DME not filling insurance correctly and was being billed. Pt had to call insurance herself and get it situated. Pt is now getting supplies and using CPAP. Pt is sleeping better and not waking up with HA. Has more energy throughout the day.      HISTORY OF PRESENT ILLNESS:  06/05/21 ALL:  Kathryn Sanders is a 45 y.o. female here today for follow up for OSA on CPAP.  She reports doing very well. She is using CPAP more consistently and feels she is resting better. Headaches are much less frequent. She denies concerns with CPAP or supplies. She has had billing questions but has resolved with her insurance.     HISTORY: (copied from Dr Guadelupe Sabin previous note)  Ms. Kathryn Sanders is a 45 year old right-handed woman with an underlying medical history of Covid related pneumonia in August 2021 and mild obesity, who presents for annual follow-up consultation of her obstructive sleep apnea after interim testing and starting AutoPap therapy.  The patient is accompanied by a Spanish interpreter today.  I first met her at the request of nurse practitioner Lazaro Arms with the [facility], at which time the patient reported snoring and excessive daytime somnolence as well as recurrent headaches.  She was advised to proceed with a sleep study.  She had a home sleep test on 05/03/2020 which indicated severe obstructive sleep apnea with an AHI of 42.4/h, O2 nadir 85%.  She was advised to start AutoPap therapy at home, set up date was 09/15/2020; she has a Personnel officer.   Today, 11/21/2020: We did a card DL, I reviewed her AutoPap compliance data for the past 30 days from 10/22/2020 through 11/20/2020, during which time she used her machine only 11 days with percent use  days greater than 4 hours at only 26.7%, indicating low compliance with an average usage for days on treatment of 4 hours and 56 minutes.  Residual AHI at 1.1/h, average leak on the higher side.  Pressure range of 7-13, 95th percentile of pressure of 8.4 cm.  Her compliance was slightly better but still suboptimal in the beginning between 09/15/2020 and 10/14/2020 she used her machine 13 out of 30 days with percent use days greater than 4 hours at 33.3%.  She reports that she is still adjusting to the treatment.  She admits that she had lapses in treatment because of a trip to [place] and she also often stays at her daughter's house and does not take the machine with her, she did not take it in [place] either.  She is motivated to continue with treatment and is willing to be consistent with it.  She has benefited in fact from treatment, and that her daytime symptoms are better, her headaches have improved and she feels that her daytime energy is better, less sleepiness.  Sometimes she sleeps well at night and sometimes she still has problems at night.  She also occasionally has mid sternal chest pain and occasional shortness of breath during the day.  She has not seen her primary care for this.  She uses a full facemask.  We talked about her home sleep test results and reviewed her compliance data in detail and we also talked about the insurance requirement  for compliance and the timeline for this.     REVIEW OF SYSTEMS: Out of a complete 14 system review of symptoms, the patient complains only of the following symptoms, headaches and all other reviewed systems are negative.  ESS: 6  ALLERGIES: No Known Allergies  HOME MEDICATIONS: Outpatient Medications Prior to Visit  Medication Sig Dispense Refill   Krill Oil (OMEGA-3) 500 MG CAPS Take 1 capsule by mouth daily at 12 noon.     vitamin B-12 (CYANOCOBALAMIN) 50 MCG tablet Take 50 mcg by mouth daily.     VITAMIN E PO Take by mouth daily.     albuterol  (VENTOLIN HFA) 108 (90 Base) MCG/ACT inhaler Inhale 2 puffs into the lungs every 4 (four) hours as needed for wheezing or shortness of breath. 8 g 1   benzonatate (TESSALON) 100 MG capsule Take 1 capsule (100 mg total) by mouth every 8 (eight) hours. 21 capsule 0   HYDROcodone-homatropine (HYCODAN) 5-1.5 MG/5ML syrup Take 5 mLs by mouth every 6 (six) hours as needed for cough. 120 mL 0   ibuprofen (ADVIL) 600 MG tablet Take 1 tablet (600 mg total) by mouth every 8 (eight) hours as needed. 90 tablet 0   ondansetron (ZOFRAN ODT) 4 MG disintegrating tablet Take 1 tablet (4 mg total) by mouth every 8 (eight) hours as needed for nausea or vomiting. 20 tablet 0   polyethylene glycol powder (GLYCOLAX/MIRALAX) 17 GM/SCOOP powder Take 17 g by mouth 2 (two) times daily as needed. 3350 g 1   senna (SENOKOT) 8.6 MG TABS tablet Take 1 tablet (8.6 mg total) by mouth at bedtime as needed for mild constipation. 120 tablet 0   No facility-administered medications prior to visit.    PAST MEDICAL HISTORY: History reviewed. No pertinent past medical history.  PAST SURGICAL HISTORY: Past Surgical History:  Procedure Laterality Date   ABDOMINAL SURGERY      FAMILY HISTORY: Family History  Problem Relation Age of Onset   Healthy Mother    Healthy Father     SOCIAL HISTORY: Social History   Socioeconomic History   Marital status: Single    Spouse name: Not on file   Number of children: Not on file   Years of education: Not on file   Highest education level: Not on file  Occupational History   Not on file  Tobacco Use   Smoking status: Never   Smokeless tobacco: Never  Substance and Sexual Activity   Alcohol use: Never   Drug use: Never   Sexual activity: Not Currently  Other Topics Concern   Not on file  Social History Narrative   Not on file   Social Determinants of Health   Financial Resource Strain: Not on file  Food Insecurity: Not on file  Transportation Needs: Not on file   Physical Activity: Not on file  Stress: Not on file  Social Connections: Not on file  Intimate Partner Violence: Not on file     PHYSICAL EXAM  Vitals:   06/05/21 1424  BP: 125/65  Pulse: (!) 54  Weight: 170 lb 12.8 oz (77.5 kg)  Height: 5' 1"  (1.549 m)   Body mass index is 32.27 kg/m.  Generalized: Well developed, in no acute distress  Cardiology: normal rate and rhythm, no murmur noted Respiratory: clear to auscultation bilaterally  Neurological examination  Mentation: Alert oriented to time, place, history taking. Follows all commands speech and language fluent Cranial nerve II-XII: Pupils were equal round reactive to light. Extraocular movements  were full, visual field were full  Motor: The motor testing reveals 5 over 5 strength of all 4 extremities. Good symmetric motor tone is noted throughout.  Gait and station: Gait is normal.    DIAGNOSTIC DATA (LABS, IMAGING, TESTING) - I reviewed patient records, labs, notes, testing and imaging myself where available.  No flowsheet data found.   Lab Results  Component Value Date   WBC 4.7 03/07/2020   HGB 13.1 03/07/2020   HCT 40.2 03/07/2020   MCV 94 03/07/2020   PLT 298 03/07/2020      Component Value Date/Time   NA 143 03/07/2020 1344   K 4.2 03/07/2020 1344   CL 105 03/07/2020 1344   CO2 26 03/07/2020 1344   GLUCOSE 101 (H) 03/07/2020 1344   GLUCOSE 114 (H) 02/16/2020 1244   BUN 13 03/07/2020 1344   CREATININE 0.72 03/07/2020 1344   CALCIUM 8.9 03/07/2020 1344   PROT 6.6 03/07/2020 1344   ALBUMIN 3.9 03/07/2020 1344   AST 24 03/07/2020 1344   ALT 50 (H) 03/07/2020 1344   ALKPHOS 197 (H) 03/07/2020 1344   BILITOT 0.3 03/07/2020 1344   GFRNONAA 104 03/07/2020 1344   GFRAA 119 03/07/2020 1344   Lab Results  Component Value Date   CHOL 156 09/21/2019   HDL 62 09/21/2019   LDLCALC 79 09/21/2019   TRIG 48 02/14/2020   CHOLHDL 2.5 09/21/2019   No results found for: HGBA1C No results found for:  VITAMINB12 No results found for: TSH   ASSESSMENT AND PLAN 45 y.o. year old female  has no past medical history on file. here with     ICD-10-CM   1. OSA on CPAP  G47.33    Z99.89         [place] is doing well on CPAP therapy. Compliance report reveals improving compliance. Now 72% compliant with daily and 55% compliant with four hour usage. She was encouraged to continue using CPAP nightly and for greater than 4 hours each night. We will update supply orders as indicated. Risks of untreated sleep apnea review and education materials provided. Healthy lifestyle habits encouraged. She will follow up in 6-8 months, sooner if needed. She verbalizes understanding and agreement with this plan.   Spanish interpreter utilized for visit.    No orders of the defined types were placed in this encounter.    No orders of the defined types were placed in this encounter.     Debbora Presto, FNP-C 06/05/2021, 2:48 PM [facility] [address] [place], [place] [address] [phone]

## 2021-06-04 NOTE — Patient Instructions (Addendum)
Please continue using your CPAP regularly. While your insurance requires that you use CPAP at least 4 hours each night on 70% of the nights, I recommend, that you not skip any nights and use it throughout the night if you can. Getting used to CPAP and staying with the treatment long term does take time and patience and discipline. Untreated obstructive sleep apnea when it is moderate to severe can have an adverse impact on cardiovascular health and raise her risk for heart disease, arrhythmias, hypertension, congestive heart failure, stroke and diabetes. Untreated obstructive sleep apnea causes sleep disruption, nonrestorative sleep, and sleep deprivation. This can have an impact on your day to day functioning and cause daytime sleepiness and impairment of cognitive function, memory loss, mood disturbance, and problems focussing. Using CPAP regularly can improve these symptoms.   Reach out to [facility] for any billing concerns.   DME: [facility] Phone: [phone], press option 1 Fax: [phone]

## 2021-06-05 ENCOUNTER — Encounter: Payer: Self-pay | Admitting: Family Medicine

## 2021-06-05 ENCOUNTER — Ambulatory Visit: Payer: BC Managed Care – PPO | Admitting: Family Medicine

## 2021-06-05 ENCOUNTER — Other Ambulatory Visit: Payer: Self-pay

## 2021-06-05 VITALS — BP 125/65 | HR 54 | Ht 61.0 in | Wt 170.8 lb

## 2021-06-05 DIAGNOSIS — Z9989 Dependence on other enabling machines and devices: Secondary | ICD-10-CM

## 2021-06-05 DIAGNOSIS — G4733 Obstructive sleep apnea (adult) (pediatric): Secondary | ICD-10-CM

## 2021-06-06 NOTE — Progress Notes (Signed)
CM sent to [facility] 

## 2021-06-17 DIAGNOSIS — G4733 Obstructive sleep apnea (adult) (pediatric): Secondary | ICD-10-CM | POA: Diagnosis not present

## 2021-06-27 DIAGNOSIS — G4733 Obstructive sleep apnea (adult) (pediatric): Secondary | ICD-10-CM | POA: Diagnosis not present

## 2021-07-18 DIAGNOSIS — G4733 Obstructive sleep apnea (adult) (pediatric): Secondary | ICD-10-CM | POA: Diagnosis not present

## 2021-07-23 IMAGING — CT CT ABD-PELV W/ CM
2 of 5 series · 17 of 46 positions shown, 19 images · IV contrast (APPLIED)
Comparison: None.

CLINICAL DATA: Generalized periumbilical pain

EXAM:
CT ABDOMEN AND PELVIS WITH CONTRAST
TECHNIQUE: Multidetector CT imaging of the abdomen and pelvis was performed
using the standard protocol following bolus administration of
intravenous contrast.
CONTRAST:  100mL OMNIPAQUE IOHEXOL 300 MG/ML  SOLN

[Series 3: abd/ pelvis 5.0 i30f 2 · axial · 0.96mm/px · z∈[+697,+1127]mm · 14 of 96 slices shown, 16 images]
[im 5/96  soft-tissue]
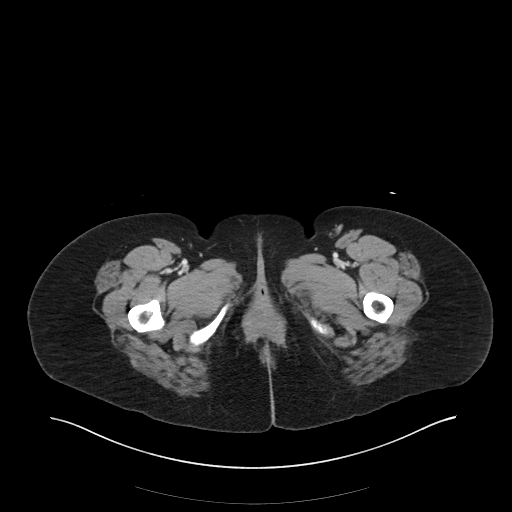
[im 5/96  bone]
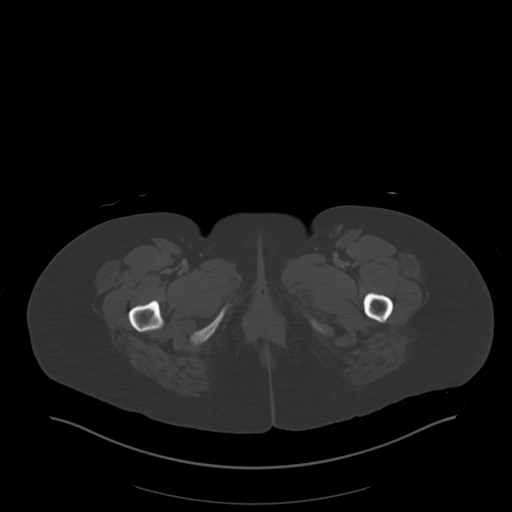
[im 15/96  soft-tissue]
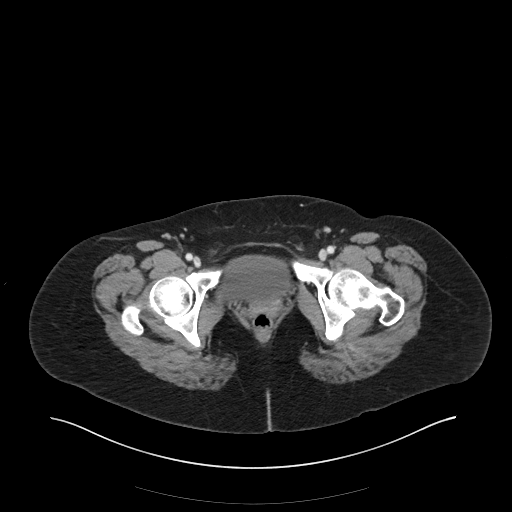
[im 20/96  soft-tissue]
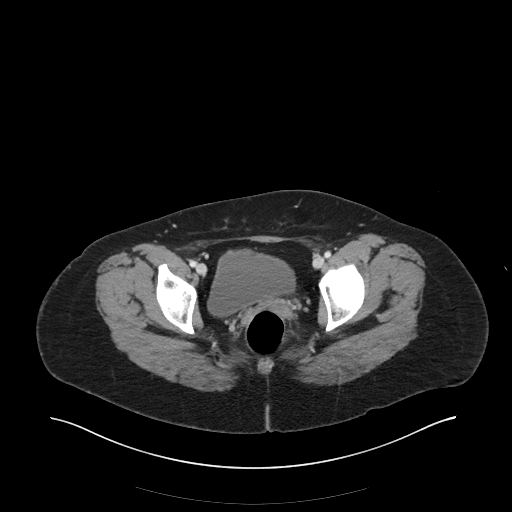
[im 24/96  soft-tissue]
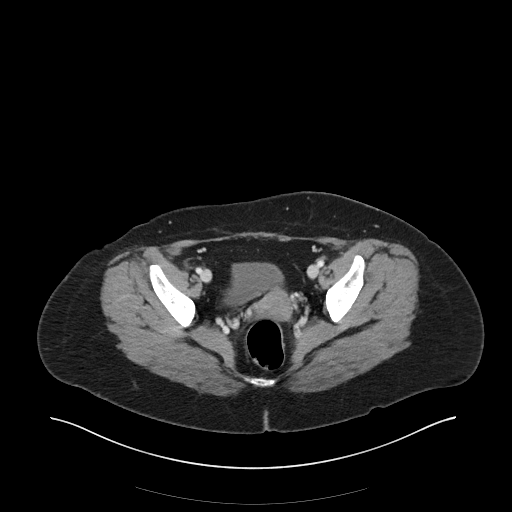
[im 34/96  soft-tissue]
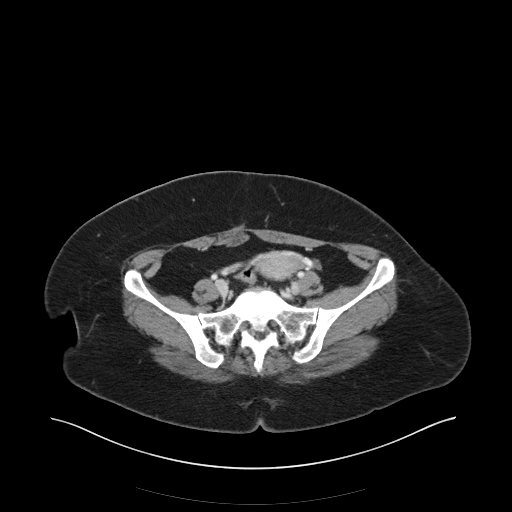
[im 39/96  soft-tissue]
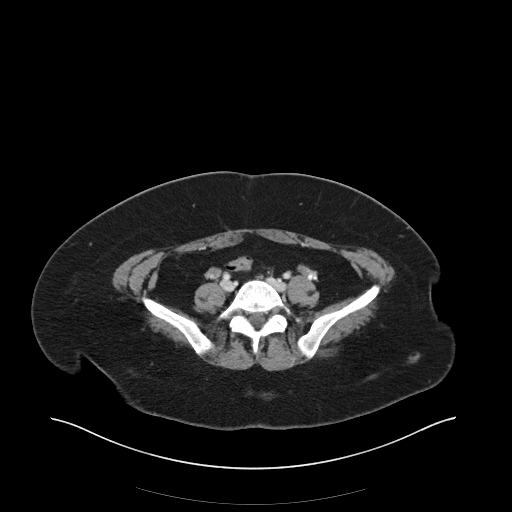
[im 43/96  soft-tissue]
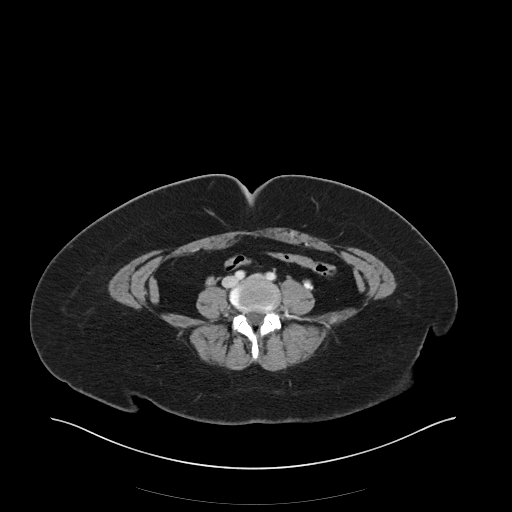
[im 53/96  soft-tissue]
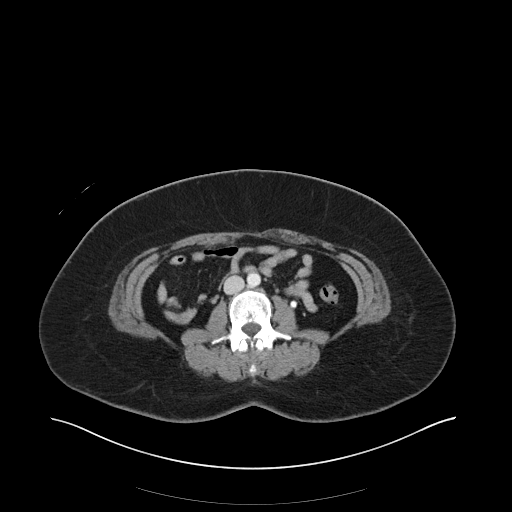
[im 58/96  soft-tissue]
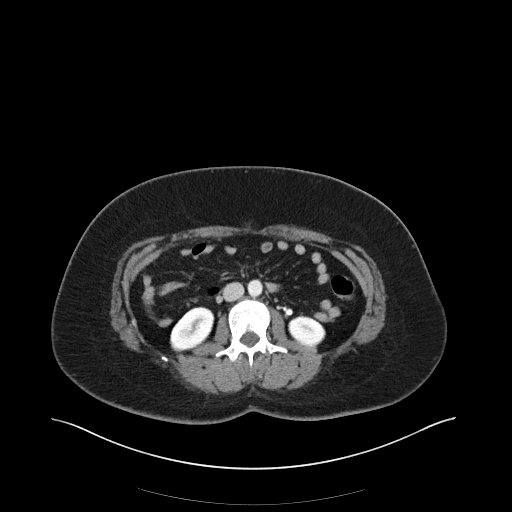
[im 58/96  bone]
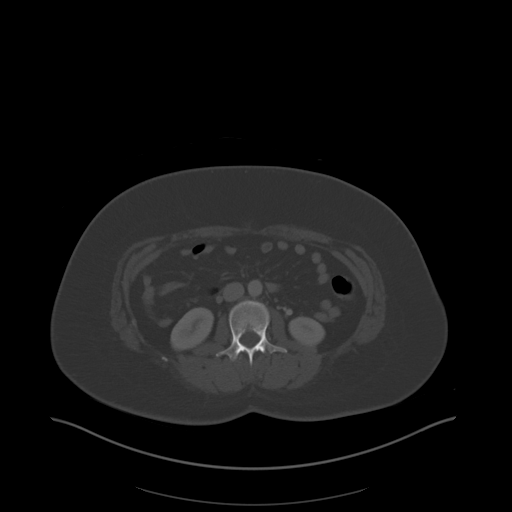
[im 62/96  soft-tissue]
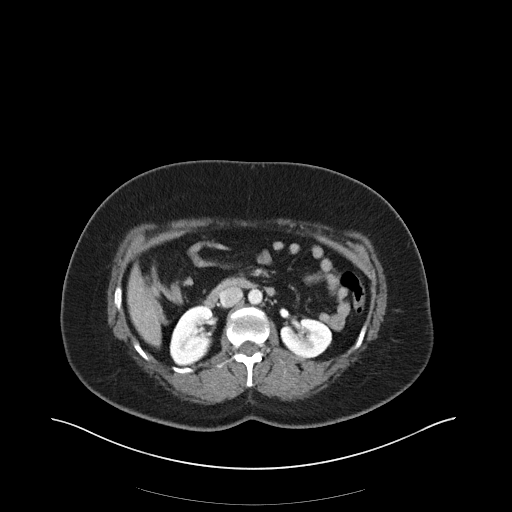
[im 72/96  soft-tissue]
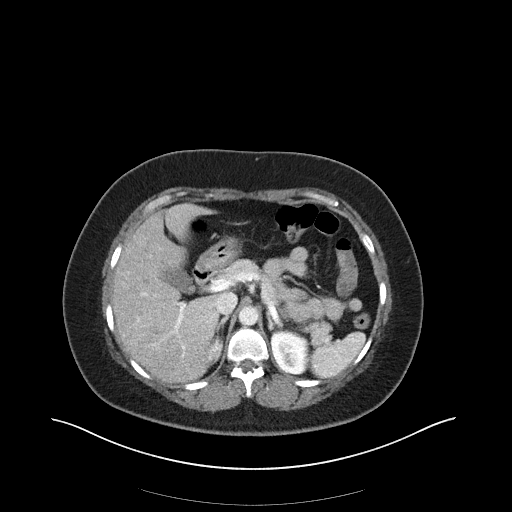
[im 77/96  soft-tissue]
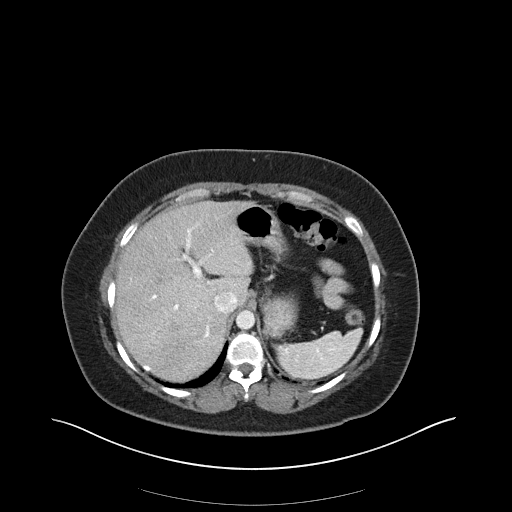
[im 81/96  soft-tissue]
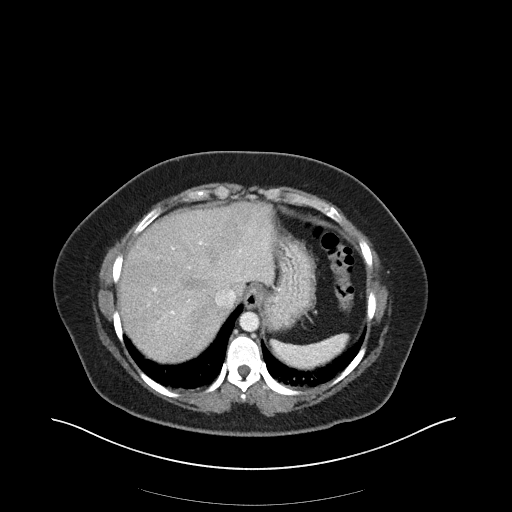
[im 91/96  soft-tissue]
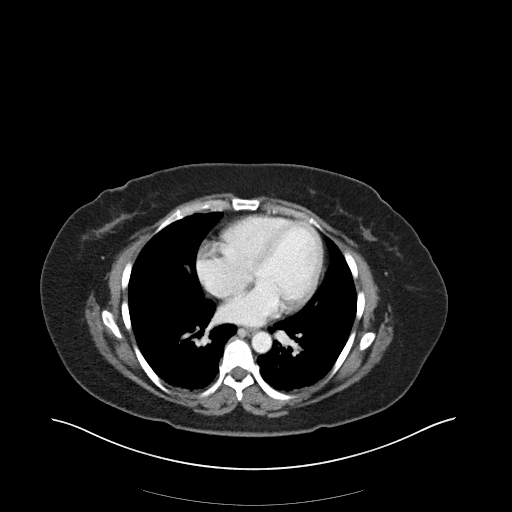

[Series 6: coronal soft tissue · coronal · 0.83mm/px · 3 of 100 slices shown]
[im 34/100  soft-tissue]
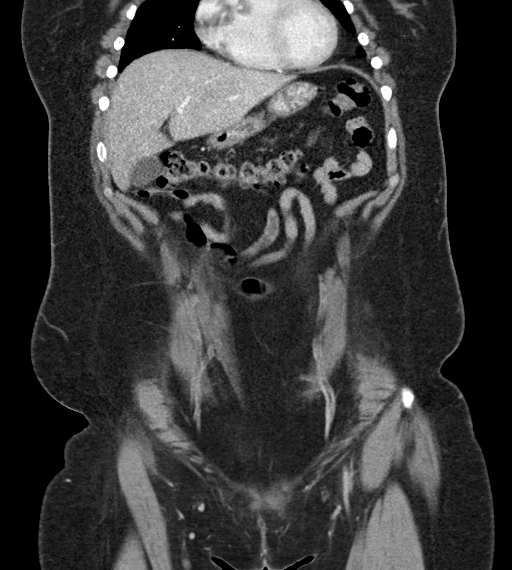
[im 45/100  soft-tissue]
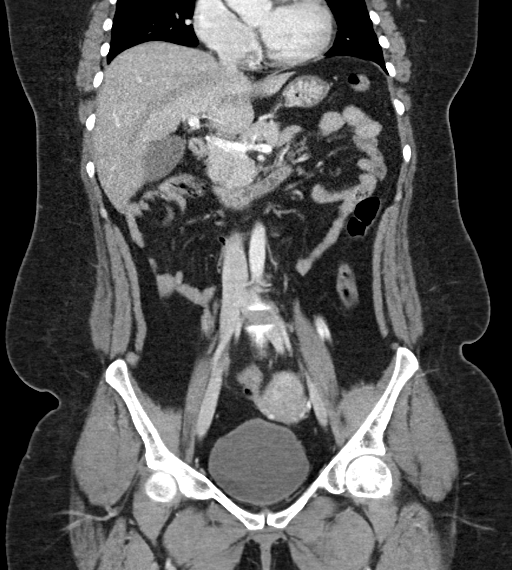
[im 56/100  soft-tissue]
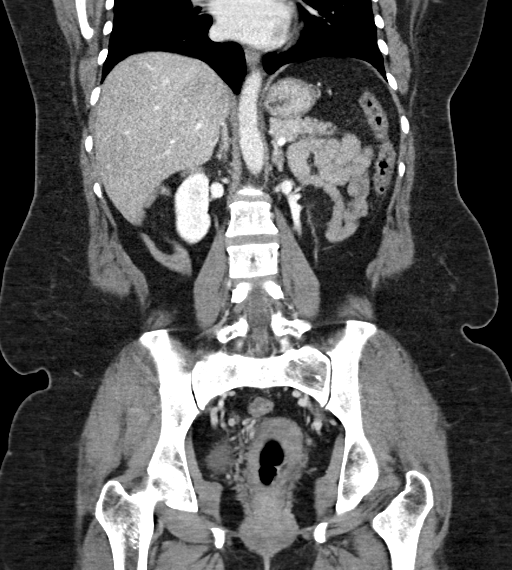

[17 of 46 positions shown; findings below may reference images not displayed]

FINDINGS: Lower chest: No acute abnormality.

Hepatobiliary: No focal liver abnormality is seen. No gallstones,
gallbladder wall thickening, or biliary dilatation.

Pancreas: Unremarkable. No pancreatic ductal dilatation or
surrounding inflammatory changes.

Spleen: Normal in size without focal abnormality.

Adrenals/Urinary Tract: Adrenal glands are unremarkable. Kidneys are
normal, without renal calculi, focal lesion, or hydronephrosis.
Bladder is unremarkable.

Stomach/Bowel: Stomach is nonenlarged. No dilated small bowel. No
bowel wall thickening. High-riding cecum in the right upper
quadrant. Slightly unusual configuration of the ileocecal junction,
though without evidence for mass, obstruction, or definitive
intussusception.

Vascular/Lymphatic: No significant vascular findings are present. No
enlarged abdominal or pelvic lymph nodes.

Reproductive: Uterus and bilateral adnexa are unremarkable.

Other: Negative for free air or free fluid

Musculoskeletal: No acute or significant osseous findings.
IMPRESSION: No CT evidence for acute intra-abdominal or pelvic abnormality.

## 2021-08-18 DIAGNOSIS — G4733 Obstructive sleep apnea (adult) (pediatric): Secondary | ICD-10-CM | POA: Diagnosis not present

## 2021-09-15 DIAGNOSIS — G4733 Obstructive sleep apnea (adult) (pediatric): Secondary | ICD-10-CM | POA: Diagnosis not present

## 2021-12-05 NOTE — Patient Instructions (Incomplete)
Please continue using your CPAP regularly. While your insurance requires that you use CPAP at least 4 hours each night on 70% of the nights, I recommend, that you not skip any nights and use it throughout the night if you can. Getting used to CPAP and staying with the treatment long term does take time and patience and discipline. Untreated obstructive sleep apnea when it is moderate to severe can have an adverse impact on cardiovascular health and raise her risk for heart disease, arrhythmias, hypertension, congestive heart failure, stroke and diabetes. Untreated obstructive sleep apnea causes sleep disruption, nonrestorative sleep, and sleep deprivation. This can have an impact on your day to day functioning and cause daytime sleepiness and impairment of cognitive function, memory loss, mood disturbance, and problems focussing. Using CPAP regularly can improve these symptoms.  Follow up in  

## 2021-12-05 NOTE — Progress Notes (Deleted)
PATIENT: Kathryn Sanders DOB: 1975-11-30  REASON FOR VISIT: follow up HISTORY FROM: patient  No chief complaint on file.    HISTORY OF PRESENT ILLNESS:  12/05/21 ALL:  Kathryn Sanders returns for follow up for OSA on CPAP. She was last seen 05/2021 and encouraged to continue working toward compliance goals. Daily compliance as 72% and 4 hour compliance was 55%. Since,     06/05/2021 ALL:  Kathryn Sanders is a 46 y.o. female here today for follow up for OSA on CPAP.  She reports doing very well. She is using CPAP more consistently and feels she is resting better. Headaches are much less frequent. She denies concerns with CPAP or supplies. She has had billing questions but has resolved with her insurance.     HISTORY: (copied from Dr Guadelupe Sabin previous note)  Kathryn Sanders is a 46 year old right-handed woman with an underlying medical history of Covid related pneumonia in August 2021 and mild obesity, who presents for annual follow-up consultation of her obstructive sleep apnea after interim testing and starting AutoPap therapy.  The patient is accompanied by a Spanish interpreter today.  I first met her at the request of nurse practitioner Lazaro Arms with the [place] [facility], at which time the patient reported snoring and excessive daytime somnolence as well as recurrent headaches.  She was advised to proceed with a sleep study.  She had a home sleep test on 05/03/2020 which indicated severe obstructive sleep apnea with an AHI of 42.4/h, O2 nadir 85%.  She was advised to start AutoPap therapy at home, set up date was 09/15/2020; she has a Personnel officer.   Today, 11/21/2020: We did a card DL, I reviewed her AutoPap compliance data for the past 30 days from 10/22/2020 through 11/20/2020, during which time she used her machine only 11 days with percent use days greater than 4 hours at only 26.7%, indicating low compliance with an average usage for days on treatment of 4 hours and 56 minutes.  Residual AHI at 1.1/h,  average leak on the higher side.  Pressure range of 7-13, 95th percentile of pressure of 8.4 cm.  Her compliance was slightly better but still suboptimal in the beginning between 09/15/2020 and 10/14/2020 she used her machine 13 out of 30 days with percent use days greater than 4 hours at 33.3%.  She reports that she is still adjusting to the treatment.  She admits that she had lapses in treatment because of a trip to [place] and she also often stays at her daughter's house and does not take the machine with her, she did not take it in [place] either.  She is motivated to continue with treatment and is willing to be consistent with it.  She has benefited in fact from treatment, and that her daytime symptoms are better, her headaches have improved and she feels that her daytime energy is better, less sleepiness.  Sometimes she sleeps well at night and sometimes she still has problems at night.  She also occasionally has mid sternal chest pain and occasional shortness of breath during the day.  She has not seen her primary care for this.  She uses a full facemask.  We talked about her home sleep test results and reviewed her compliance data in detail and we also talked about the insurance requirement for compliance and the timeline for this.     REVIEW OF SYSTEMS: Out of a complete 14 system review of symptoms, the patient complains only of the following  symptoms, headaches and all other reviewed systems are negative.  ESS: 6  ALLERGIES: No Known Allergies  HOME MEDICATIONS: Outpatient Medications Prior to Visit  Medication Sig Dispense Refill   Krill Oil (OMEGA-3) 500 MG CAPS Take 1 capsule by mouth daily at 12 noon.     vitamin B-12 (CYANOCOBALAMIN) 50 MCG tablet Take 50 mcg by mouth daily.     VITAMIN E PO Take by mouth daily.     No facility-administered medications prior to visit.    PAST MEDICAL HISTORY: No past medical history on file.  PAST SURGICAL HISTORY: Past Surgical History:   Procedure Laterality Date   ABDOMINAL SURGERY      FAMILY HISTORY: Family History  Problem Relation Age of Onset   Healthy Mother    Healthy Father     SOCIAL HISTORY: Social History   Socioeconomic History   Marital status: Single    Spouse name: Not on file   Number of children: Not on file   Years of education: Not on file   Highest education level: Not on file  Occupational History   Not on file  Tobacco Use   Smoking status: Never   Smokeless tobacco: Never  Substance and Sexual Activity   Alcohol use: Never   Drug use: Never   Sexual activity: Not Currently  Other Topics Concern   Not on file  Social History Narrative   Not on file   Social Determinants of Health   Financial Resource Strain: Not on file  Food Insecurity: Not on file  Transportation Needs: Not on file  Physical Activity: Not on file  Stress: Not on file  Social Connections: Not on file  Intimate Partner Violence: Not on file     PHYSICAL EXAM  There were no vitals filed for this visit.  There is no height or weight on file to calculate BMI.  Generalized: Well developed, in no acute distress  Cardiology: normal rate and rhythm, no murmur noted Respiratory: clear to auscultation bilaterally  Neurological examination  Mentation: Alert oriented to time, place, history taking. Follows all commands speech and language fluent Cranial nerve II-XII: Pupils were equal round reactive to light. Extraocular movements were full, visual field were full  Motor: The motor testing reveals 5 over 5 strength of all 4 extremities. Good symmetric motor tone is noted throughout.  Gait and station: Gait is normal.    DIAGNOSTIC DATA (LABS, IMAGING, TESTING) - I reviewed patient records, labs, notes, testing and imaging myself where available.      View : No data to display.           Lab Results  Component Value Date   WBC 4.7 03/07/2020   HGB 13.1 03/07/2020   HCT 40.2 03/07/2020   MCV 94  03/07/2020   PLT 298 03/07/2020      Component Value Date/Time   NA 143 03/07/2020 1344   K 4.2 03/07/2020 1344   CL 105 03/07/2020 1344   CO2 26 03/07/2020 1344   GLUCOSE 101 (H) 03/07/2020 1344   GLUCOSE 114 (H) 02/16/2020 1244   BUN 13 03/07/2020 1344   CREATININE 0.72 03/07/2020 1344   CALCIUM 8.9 03/07/2020 1344   PROT 6.6 03/07/2020 1344   ALBUMIN 3.9 03/07/2020 1344   AST 24 03/07/2020 1344   ALT 50 (H) 03/07/2020 1344   ALKPHOS 197 (H) 03/07/2020 1344   BILITOT 0.3 03/07/2020 1344   GFRNONAA 104 03/07/2020 1344   GFRAA 119 03/07/2020 1344   Lab  Results  Component Value Date   CHOL 156 09/21/2019   HDL 62 09/21/2019   LDLCALC 79 09/21/2019   TRIG 48 02/14/2020   CHOLHDL 2.5 09/21/2019   No results found for: HGBA1C No results found for: VITAMINB12 No results found for: TSH   ASSESSMENT AND PLAN 46 y.o. year old female  has no past medical history on file. here with   No diagnosis found.    Kathryn Sanders is doing well on CPAP therapy. Compliance report reveals improving compliance. Now 72% compliant with daily and 55% compliant with four hour usage. She was encouraged to continue using CPAP nightly and for greater than 4 hours each night. We will update supply orders as indicated. Risks of untreated sleep apnea review and education materials provided. Healthy lifestyle habits encouraged. She will follow up in 6-8 months, sooner if needed. She verbalizes understanding and agreement with this plan.   Spanish interpreter utilized for visit.    No orders of the defined types were placed in this encounter.     No orders of the defined types were placed in this encounter.      Debbora Presto, FNP-C 12/05/2021, 12:30 PM [facility] [address], [place], [place] [address] [phone]

## 2021-12-06 ENCOUNTER — Ambulatory Visit: Payer: BC Managed Care – PPO | Admitting: Family Medicine

## 2022-01-26 ENCOUNTER — Other Ambulatory Visit: Payer: Self-pay

## 2022-01-26 ENCOUNTER — Emergency Department (HOSPITAL_COMMUNITY): Payer: 59

## 2022-01-26 ENCOUNTER — Emergency Department (HOSPITAL_COMMUNITY)
Admission: EM | Admit: 2022-01-26 | Discharge: 2022-01-26 | Disposition: A | Discharge status: Home / Self Care | Payer: 59 | Attending: Emergency Medicine | Admitting: Emergency Medicine

## 2022-01-26 DIAGNOSIS — Y990 Civilian activity done for income or pay: Secondary | ICD-10-CM | POA: Diagnosis not present

## 2022-01-26 DIAGNOSIS — M79642 Pain in left hand: Secondary | ICD-10-CM | POA: Insufficient documentation

## 2022-01-26 DIAGNOSIS — X500XXA Overexertion from strenuous movement or load, initial encounter: Secondary | ICD-10-CM | POA: Diagnosis not present

## 2022-01-26 MED ORDER — FAMOTIDINE 20 MG PO TABS
20.0000 mg | ORAL_TABLET | Freq: Once | ORAL | Status: AC
Start: 2022-01-26 — End: 2022-01-26
  Administered 2022-01-26: 20 mg via ORAL
  Filled 2022-01-26: qty 1

## 2022-01-26 MED ORDER — IBUPROFEN 400 MG PO TABS
600.0000 mg | ORAL_TABLET | Freq: Once | ORAL | Status: AC
  Administered 2022-01-26: 600 mg via ORAL
  Filled 2022-01-26: qty 1

## 2022-01-26 NOTE — ED Provider Notes (Signed)
[facility] EMERGENCY DEPARTMENT Provider Note   CSN: [id number] Arrival date & time: 01/26/22  1023     History  Chief Complaint  Patient presents with   Hand Pain    Kathryn Sanders is a 46 y.o. female.  Kathryn Sanders is a 46 y.o. female who is otherwise healthy, presents for evaluation of left hand pain.  Patient reports that just prior to arrival while she was at work she was bending down to pick up boxes and felt a sharp pain on the dorsal surface of her left hand.  She reports that she is concerned she may have been bitten or stung by an insect but did not see any bug specifically.  She reports that initially it was swollen but the swelling has improved somewhat.  She initially had some itching.  She reports pain radiates up into her forearm.  No diffuse rash, facial swelling or difficulty breathing.  No known allergies  The history is provided by the patient. The history is limited by a language barrier. A language interpreter was used.  Hand Pain       Home Medications Prior to Admission medications   Medication Sig Start Date End Date Taking? Authorizing Provider  Boris Lown Oil (OMEGA-3) 500 MG CAPS Take 1 capsule by mouth daily at 12 noon.    [provider]  vitamin B-12 (CYANOCOBALAMIN) 50 MCG tablet Take 50 mcg by mouth daily.    [provider]  VITAMIN E PO Take by mouth daily.    [provider]      Allergies    Patient has no known allergies.    Review of Systems   Review of Systems  Constitutional:  Negative for chills and fever.  Musculoskeletal:        Hand pain  Skin:  Negative for rash.    Physical Exam Updated Vital Signs BP 130/87 (BP Location: Right Arm)   Pulse (!) 55   Temp 98.2 F (36.8 C) (Oral)   Resp 18   SpO2 97%  Physical Exam Vitals and nursing note reviewed.  Constitutional:      General: She is not in acute distress.    Appearance: Normal appearance. She is well-developed. She is  not ill-appearing or diaphoretic.  HENT:     Head: Normocephalic and atraumatic.  Eyes:     General:        Right eye: No discharge.        Left eye: No discharge.  Pulmonary:     Effort: Pulmonary effort is normal. No respiratory distress.  Musculoskeletal:     Comments: Tenderness over the dorsum of the left hand with minimal swelling, no appreciable overlying skin lesions or rash.  Distal pulses 2+, normal cap refill, normal sensation and strength, no palpable bony deformity.  Neurological:     Mental Status: She is alert and oriented to person, place, and time.     Coordination: Coordination normal.  Psychiatric:        Mood and Affect: Mood normal.        Behavior: Behavior normal.     ED Results / Procedures / Treatments   Labs (all labs ordered are listed, but only abnormal results are displayed) Labs Reviewed - No data to display  EKG None  Radiology DG Hand Complete Left  Result Date: 01/26/2022 CLINICAL DATA:  Left hand pain and swelling. EXAM: LEFT HAND - COMPLETE 3+ VIEW COMPARISON:  None Available. FINDINGS: There is no  evidence of fracture or dislocation. There is no evidence of arthropathy or other focal bone abnormality. Soft tissues are unremarkable. IMPRESSION: Negative. Electronically Signed   By: Marin Roberts M.D.   On: 01/26/2022 12:03    Procedures Procedures    Medications Ordered in ED Medications  ibuprofen (ADVIL) tablet 600 mg (600 mg Oral Given 01/26/22 1139)  famotidine (PEPCID) tablet 20 mg (20 mg Oral Given 01/26/22 1139)    ED Course/ Medical Decision Making/ A&P                           Medical Decision Making Amount and/or Complexity of Data Reviewed Radiology: ordered.   Patient presents with hand pain, concern for possible insect sting but did not see insect.  Hand is neurovascularly intact, no overlying rashes and no signs of more systemic allergic reaction or anaphylaxis.  X-ray of the left hand with no bony abnormality.   Patient treated with ibuprofen and Pepcid with improvement, hold off on Benadryl as patient is driving.  Discussed continued symptomatic care at home.  Patient appropriate for discharge home  At this time there does not appear to be any evidence of an acute emergency medical condition requiring further emergent evaluation and the patient appears stable for discharge with appropriate outpatient follow up. Diagnosis and return precautions discussed with patient who verbalizes understanding and is agreeable to discharge.           Final Clinical Impression(s) / ED Diagnoses Final diagnoses:  Left hand pain    Rx / DC Orders ED Discharge Orders     None         Dartha Lodge, Cordelia Poche 01/26/22 1702    Virgina Norfolk, DO 01/27/22 0710

## 2022-01-26 NOTE — Discharge Instructions (Signed)
X-ray shows no evidence of fracture.  Pain may be due to an insect bite.  You can use ibuprofen 600 mg every 6 hours, Tylenol 1000 mg every 6 hours and you can use a Benadryl and/or Pepcid to help with localized allergic response to potential insect bite.  If pain is not improving please follow-up with your regular doctor.  La radiografa no muestra evidencia de fractura. El dolor puede deberse a la picadura de un insecto. Puede usar ibuprofeno 600 mg cada 6 horas, Tylenol 1000 mg cada 6 horas y puede usar Benadryl y/o Pepcid para ayudar con la respuesta alrgica localizada a una posible picadura de insecto.  Si el dolor no mejora, haga un seguimiento con su mdico habitual.

## 2022-01-26 NOTE — ED Triage Notes (Signed)
Pt presents for eval of L hand pain/swelling while lifting a box at work today and feeling like she got bit. Reports swelling is better but still has pain to hand and itchiness to forearm. No rash noted, speaking in complete sentences, no sob.

## 2022-06-09 IMAGING — CR DG CHEST 2V
2 series · 2 of 2 positions shown · non-contrast
Comparison: 02/17/2020

CLINICAL DATA: History of COVID pneumonia

EXAM:
CHEST - 2 VIEW

[w chest pa]
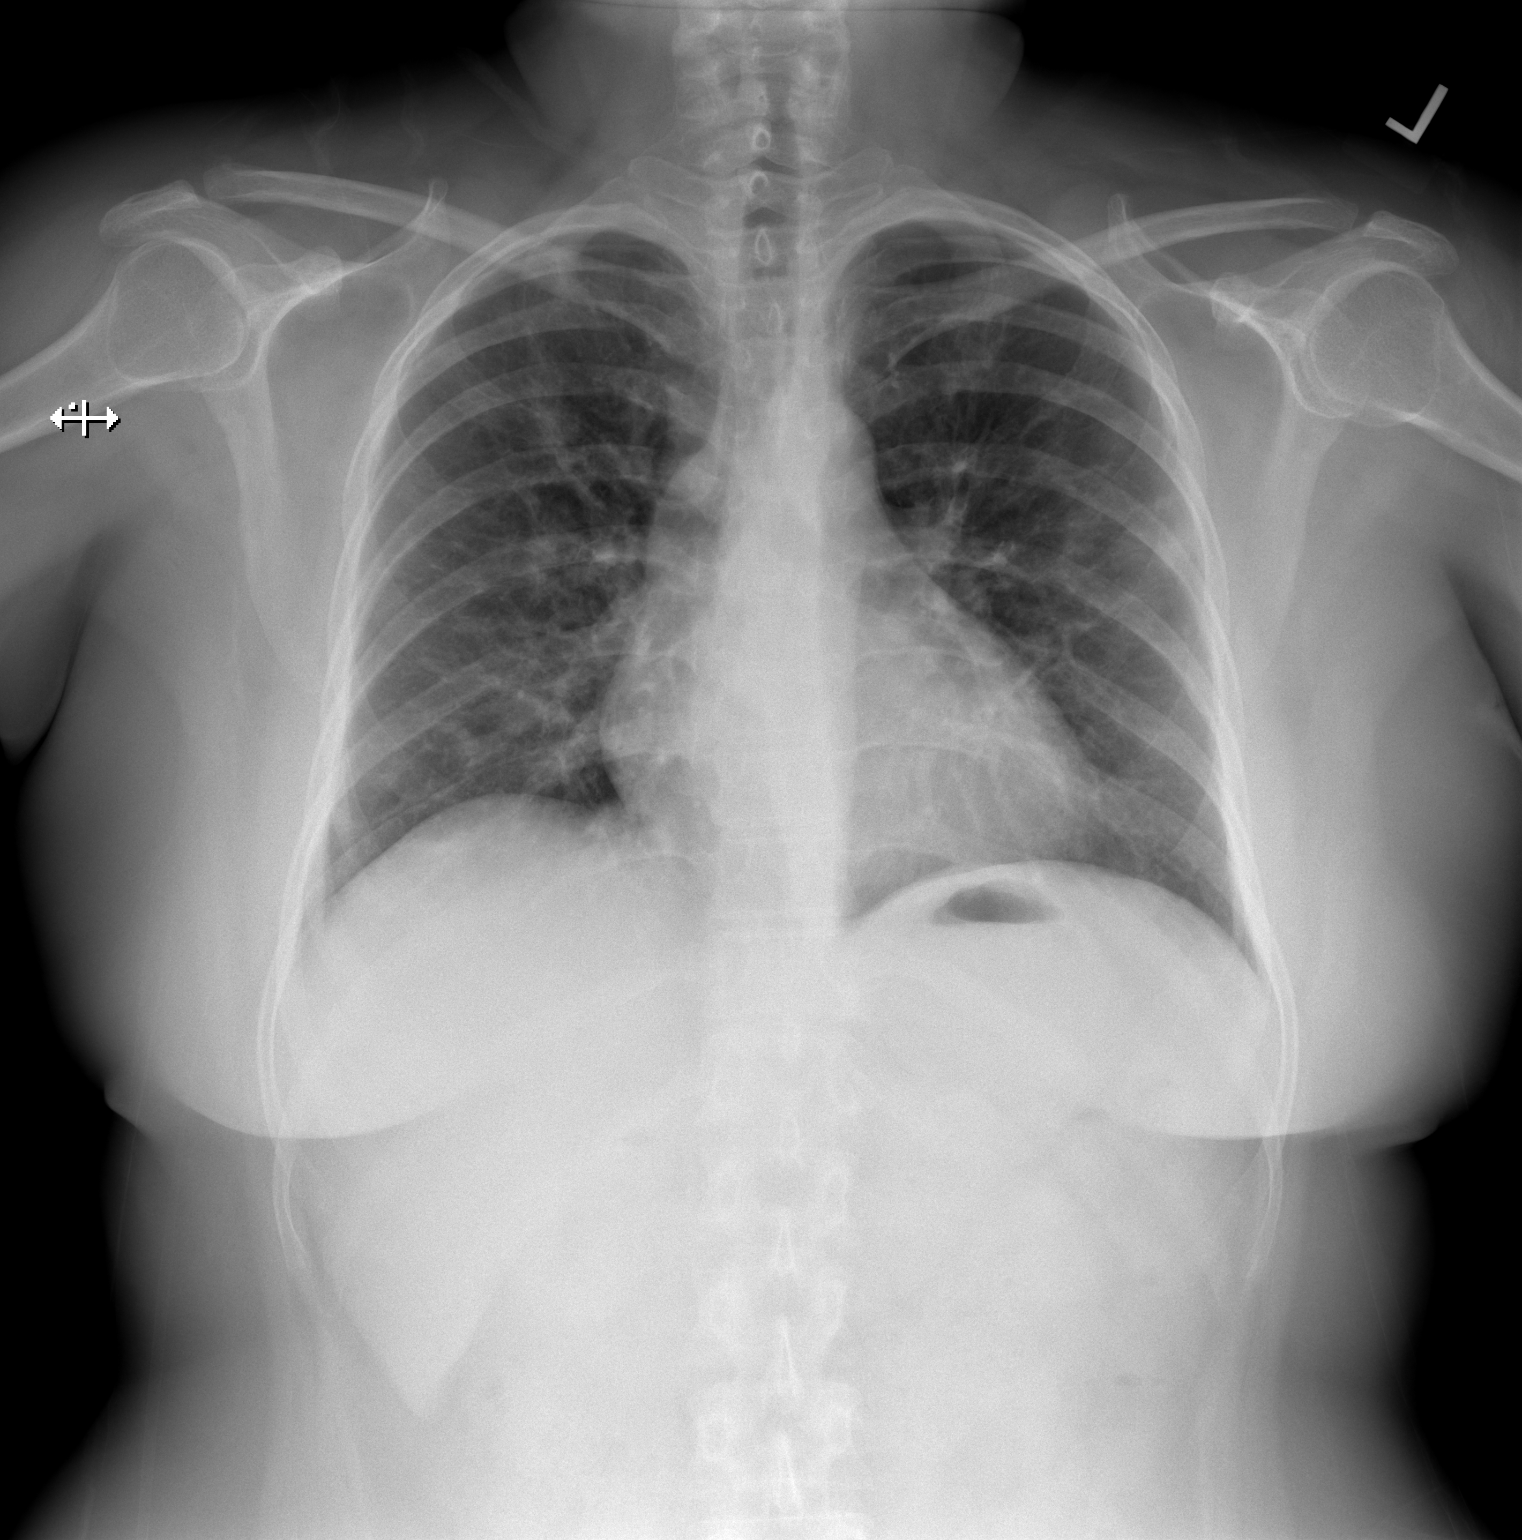

[w chest lat]
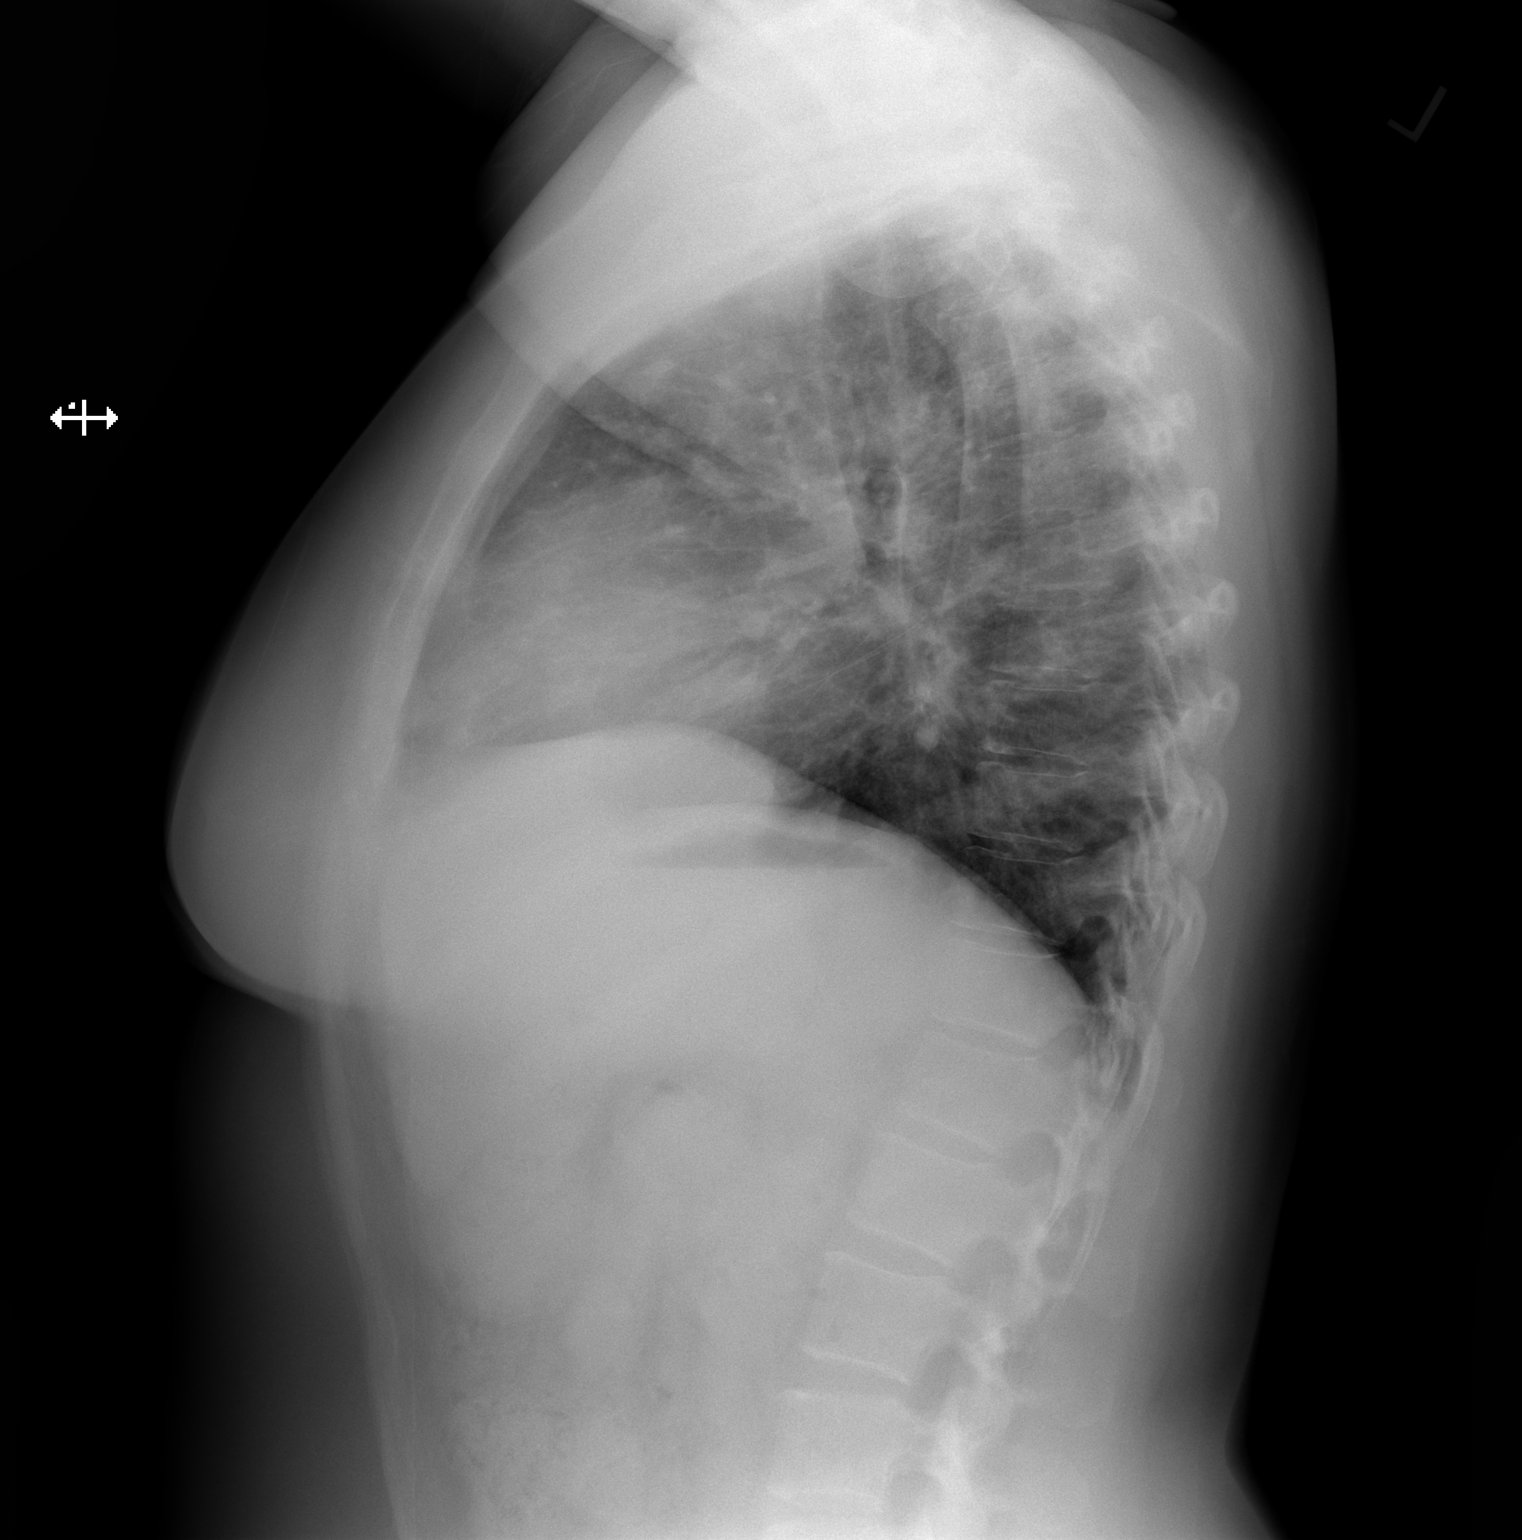

[2 of 2 positions shown; findings below may reference images not displayed]

FINDINGS: Heart size and pulmonary vascularity are normal. Coarse linear
infiltrates in the lungs, progressing since previous study.
Peribronchial thickening. No pleural effusions. No pneumothorax.
Mediastinal contours appear intact.
IMPRESSION: Coarse linear infiltrates in the lungs, progressing since previous
study. This may represent residual infiltration or developing
scarring.

## 2022-10-18 ENCOUNTER — Other Ambulatory Visit: Payer: Self-pay

## 2022-10-18 ENCOUNTER — Emergency Department (HOSPITAL_COMMUNITY): Payer: Self-pay

## 2022-10-18 ENCOUNTER — Emergency Department (HOSPITAL_COMMUNITY)
Admission: EM | Admit: 2022-10-18 | Discharge: 2022-10-18 | Disposition: A | Discharge status: Home / Self Care | Payer: Self-pay | Attending: Emergency Medicine | Admitting: Emergency Medicine

## 2022-10-18 ENCOUNTER — Encounter (HOSPITAL_COMMUNITY): Payer: Self-pay

## 2022-10-18 DIAGNOSIS — R1084 Generalized abdominal pain: Secondary | ICD-10-CM | POA: Insufficient documentation

## 2022-10-18 HISTORY — DX: Obstructive sleep apnea (adult) (pediatric): G47.33

## 2022-10-18 LAB — URINALYSIS, ROUTINE W REFLEX MICROSCOPIC
Bacteria, UA: NONE SEEN
Bilirubin Urine: NEGATIVE
Glucose, UA: NEGATIVE mg/dL
Hgb urine dipstick: NEGATIVE
Ketones, ur: NEGATIVE mg/dL
Leukocytes,Ua: NEGATIVE
Nitrite: NEGATIVE
Protein, ur: NEGATIVE mg/dL
Specific Gravity, Urine: 1.028 (ref 1.005–1.030)
pH: 6 (ref 5.0–8.0)

## 2022-10-18 LAB — CBC
HCT: 41.9 % (ref 36.0–46.0)
Hemoglobin: 14 g/dL (ref 12.0–15.0)
MCH: 31.1 pg (ref 26.0–34.0)
MCHC: 33.4 g/dL (ref 30.0–36.0)
MCV: 93.1 fL (ref 80.0–100.0)
Platelets: 246 10*3/uL (ref 150–400)
RBC: 4.5 MIL/uL (ref 3.87–5.11)
RDW: 12.7 % (ref 11.5–15.5)
WBC: 9.2 10*3/uL (ref 4.0–10.5)
nRBC: 0 % (ref 0.0–0.2)

## 2022-10-18 LAB — COMPREHENSIVE METABOLIC PANEL
ALT: 185 U/L — ABNORMAL HIGH (ref 0–44)
AST: 106 U/L — ABNORMAL HIGH (ref 15–41)
Albumin: 4.2 g/dL (ref 3.5–5.0)
Alkaline Phosphatase: 186 U/L — ABNORMAL HIGH (ref 38–126)
Anion gap: 9 (ref 5–15)
BUN: 23 mg/dL — ABNORMAL HIGH (ref 6–20)
CO2: 22 mmol/L (ref 22–32)
Calcium: 8.9 mg/dL (ref 8.9–10.3)
Chloride: 105 mmol/L (ref 98–111)
Creatinine, Ser: 0.76 mg/dL (ref 0.44–1.00)
GFR, Estimated: >60 mL/min (ref >60)
Glucose, Bld: 107 mg/dL — ABNORMAL HIGH (ref 70–99)
Potassium: 3.7 mmol/L (ref 3.5–5.1)
Sodium: 136 mmol/L (ref 135–145)
Total Bilirubin: 0.5 mg/dL (ref 0.3–1.2)
Total Protein: 7.6 g/dL (ref 6.5–8.1)

## 2022-10-18 LAB — I-STAT BETA HCG BLOOD, ED (MC, WL, AP ONLY): I-stat hCG, quantitative: <5 m[IU]/mL (ref <5)

## 2022-10-18 LAB — LIPASE, BLOOD: Lipase: 40 U/L (ref 11–51)

## 2022-10-18 MED ORDER — FENTANYL CITRATE PF 50 MCG/ML IJ SOSY
50.0000 ug | PREFILLED_SYRINGE | Freq: Once | INTRAMUSCULAR | Status: AC
  Administered 2022-10-18: 50 ug via INTRAVENOUS
  Filled 2022-10-18: qty 1

## 2022-10-18 MED ORDER — PANTOPRAZOLE SODIUM 20 MG PO TBEC: 20.0000 mg | DELAYED_RELEASE_TABLET | Freq: Every day | ORAL | 0 refills | Status: AC

## 2022-10-18 MED ORDER — ONDANSETRON 4 MG PO TBDP: 4.0000 mg | ORAL_TABLET | Freq: Three times a day (TID) | ORAL | 0 refills | Status: DC | PRN

## 2022-10-18 MED ORDER — SODIUM CHLORIDE 0.9 % IV BOLUS
1000.0000 mL | Freq: Once | INTRAVENOUS | Status: AC
  Administered 2022-10-18: 1000 mL via INTRAVENOUS

## 2022-10-18 MED ORDER — PANTOPRAZOLE SODIUM 40 MG PO TBEC
40.0000 mg | DELAYED_RELEASE_TABLET | Freq: Once | ORAL | Status: AC
  Administered 2022-10-18: 40 mg via ORAL
  Filled 2022-10-18: qty 1

## 2022-10-18 MED ORDER — ALUM & MAG HYDROXIDE-SIMETH 200-200-20 MG/5ML PO SUSP
30.0000 mL | Freq: Once | ORAL | Status: AC
  Administered 2022-10-18: 30 mL via ORAL
  Filled 2022-10-18: qty 30

## 2022-10-18 MED ORDER — IOHEXOL 300 MG/ML  SOLN
100.0000 mL | Freq: Once | INTRAMUSCULAR | Status: AC | PRN
  Administered 2022-10-18: 100 mL via INTRAVENOUS

## 2022-10-18 MED ORDER — SUCRALFATE 1 G PO TABS: 1.0000 g | ORAL_TABLET | Freq: Three times a day (TID) | ORAL | 0 refills | Status: AC

## 2022-10-18 NOTE — ED Triage Notes (Signed)
Pt BIB EMS for ABD pain that started today while at work. Pt only speaks spanish. Pt denies n/v.

## 2022-10-18 NOTE — ED Notes (Signed)
Patient transported to CT 

## 2022-10-18 NOTE — ED Provider Notes (Signed)
[place] EMERGENCY DEPARTMENT AT [facility] Provider Note   CSN: [id number] Arrival date & time: 10/18/22  1611     History  Chief Complaint  Patient presents with   Abdominal Pain    Kathryn Sanders is a 47 y.o. female.  Patient here with diffuse abdominal pain that started a couple hours ago.  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  She denies any suspicious food intake or fever or chills.  She denies any shortness of breath or chest pain.  No recent illness.  No diarrhea.  No headache.  The history is provided by the patient.       Home Medications Prior to Admission medications   Medication Sig Start Date End Date Taking? Authorizing Provider  ondansetron (ZOFRAN-ODT) 4 MG disintegrating tablet Take 1 tablet (4 mg total) by mouth every 8 (eight) hours as needed for nausea or vomiting. 10/18/22  Yes Lashone Stauber, DO  pantoprazole (PROTONIX) 20 MG tablet Take 1 tablet (20 mg total) by mouth daily. 10/18/22 11/17/22 Yes Elfrida Pixley, DO  sucralfate (CARAFATE) 1 g tablet Take 1 tablet (1 g total) by mouth 4 (four) times daily -  with meals and at bedtime for 14 days. 10/18/22 11/01/22 Yes Jameia Makris, DO  Krill Oil (OMEGA-3) 500 MG CAPS Take 1 capsule by mouth daily at 12 noon.    [provider]  vitamin B-12 (CYANOCOBALAMIN) 50 MCG tablet Take 50 mcg by mouth daily.    [provider]  VITAMIN E PO Take by mouth daily.    [provider]      Allergies    Patient has no known allergies.    Review of Systems   Review of Systems  Physical Exam Updated Vital Signs BP 130/72   Pulse 71   Temp 97.8 F (36.6 C) (Oral)   Resp 17   Ht 5\' 1"  (1.549 m)   Wt 77.1 kg   SpO2 98%   BMI 32.12 kg/m  Physical Exam Vitals and nursing note reviewed.  Constitutional:      General: She is not in acute distress.    Appearance: She is well-developed. She is not ill-appearing.   HENT:     Head: Normocephalic and atraumatic.     Mouth/Throat:     Mouth: Mucous membranes are moist.  Eyes:     Extraocular Movements: Extraocular movements intact.     Conjunctiva/sclera: Conjunctivae normal.     Pupils: Pupils are equal, round, and reactive to light.  Cardiovascular:     Rate and Rhythm: Normal rate and regular rhythm.     Heart sounds: Normal heart sounds. No murmur heard. Pulmonary:     Effort: Pulmonary effort is normal. No respiratory distress.     Breath sounds: Normal breath sounds.  Abdominal:     Palpations: Abdomen is soft.     Tenderness: There is generalized abdominal tenderness.  Musculoskeletal:        General: No swelling.     Cervical back: Neck supple.  Skin:    General: Skin is warm and dry.     Capillary Refill: Capillary refill takes less than 2 seconds.  Neurological:     Mental Status: She is alert.  Psychiatric:        Mood and Affect: Mood normal.     ED Results / Procedures / Treatments   Labs (all labs ordered are listed, but  only abnormal results are displayed) Labs Reviewed  COMPREHENSIVE METABOLIC PANEL - Abnormal; Notable for the following components:      Result Value   Glucose, Bld 107 (*)    BUN 23 (*)    AST 106 (*)    ALT 185 (*)    Alkaline Phosphatase 186 (*)    All other components within normal limits  LIPASE, BLOOD  CBC  URINALYSIS, ROUTINE W REFLEX MICROSCOPIC  I-STAT BETA HCG BLOOD, ED ([facility], [facility], [facility] ONLY)    EKG EKG Interpretation  Date/Time:  Friday October 18 2022 16:29:21 EDT Ventricular Rate:  61 PR Interval:  151 QRS Duration: 87 QT Interval:  425 QTC Calculation: 429 R Axis:   158 Text Interpretation: Right and left arm electrode reversal, interpretation assumes no reversal Sinus or ectopic atrial rhythm Confirmed by Virgina Norfolk ([id number]) on 10/18/2022 5:26:21 PM  Radiology CT ABDOMEN PELVIS W CONTRAST  Result Date: 10/18/2022 CLINICAL DATA:  Abdominal pain, acute, nonlocalized EXAM: CT  ABDOMEN AND PELVIS WITH CONTRAST TECHNIQUE: Multidetector CT imaging of the abdomen and pelvis was performed using the standard protocol following bolus administration of intravenous contrast. RADIATION DOSE REDUCTION: This exam was performed according to the departmental dose-optimization program which includes automated exposure control, adjustment of the mA and/or kV according to patient size and/or use of iterative reconstruction technique. CONTRAST:  OMNIPAQUE IOHEXOL 300 MG/ML  SOLN COMPARISON:  01/19/2020 FINDINGS: Lower chest: No acute abnormality Hepatobiliary: No focal hepatic abnormality. Gallbladder unremarkable. Pancreas: No focal abnormality or ductal dilatation. Spleen: No focal abnormality.  Normal size. Adrenals/Urinary Tract: No adrenal abnormality. No focal renal abnormality. No stones or hydronephrosis. Urinary bladder is unremarkable. Stomach/Bowel: Stomach, large and small bowel grossly unremarkable. Vascular/Lymphatic: No evidence of aneurysm or adenopathy. Reproductive: Uterus and adnexa unremarkable.  No mass. Other: No free fluid or free air. Musculoskeletal: No acute bony abnormality. IMPRESSION: No acute findings in the abdomen or pelvis. Electronically Signed   By: Charlett Nose M.D.   On: 10/18/2022 19:49    Procedures Procedures    Medications Ordered in ED Medications  fentaNYL (SUBLIMAZE) injection 50 mcg (50 mcg Intravenous Given 10/18/22 1741)  sodium chloride 0.9 % bolus 1,000 mL (0 mLs Intravenous Stopped 10/18/22 1851)  fentaNYL (SUBLIMAZE) injection 50 mcg (50 mcg Intravenous Given 10/18/22 1856)  iohexol (OMNIPAQUE) 300 MG/ML solution 100 mL (100 mLs Intravenous Contrast Given 10/18/22 1923)  alum & mag hydroxide-simeth (MAALOX/MYLANTA) 200-200-20 MG/5ML suspension 30 mL (30 mLs Oral Given 10/18/22 2035)  pantoprazole (PROTONIX) EC tablet 40 mg (40 mg Oral Given 10/18/22 2035)    ED Course/ Medical Decision Making/ A&P                             Medical  Decision Making Amount and/or Complexity of Data Reviewed Labs: ordered. Radiology: ordered.  Risk OTC drugs. Prescription drug management.  Kathryn Sanders is here with abdominal pain.  Normal vitals.  No fever.  Sounds like she has had abdominal surgery in the past for tumor removal.  Does not sound like it was cancerous.  This is back in [place] 7 years ago.  She has had abdominal pain for couple hours.  Diffuse pain.  Differential diagnosis could be bowel obstruction versus some sort of enteritis, pancreatitis, seems less likely to be cholecystitis or diverticulitis.  Will get CBC, CMP, lipase, urinalysis and CT scan abdomen pelvis.  Will give IV fluid bolus and IV fentanyl and  reevaluate.  Per my review and interpretation the labs as far her liver enzymes are mildly elevated but this appears to be similar to her prior labs.  Her lipase is normal.  She has no significant leukocytosis or anemia.  Awaiting CT scan abdomen pelvis.  CT scan is unremarkable.  Urinalysis unremarkable per my review and interpretation.  I reviewed radiology report.  I suspect maybe this could be gastritis.  Will put her on Protonix and Carafate and Zofran.  Discharged in good condition.  Understands return precautions.  This chart was dictated using voice recognition software.  Despite best efforts to proofread,  errors can occur which can change the documentation meaning.         Final Clinical Impression(s) / ED Diagnoses Final diagnoses:  Generalized abdominal pain    Rx / DC Orders ED Discharge Orders          Ordered    ondansetron (ZOFRAN-ODT) 4 MG disintegrating tablet  Every 8 hours PRN        10/18/22 2035    pantoprazole (PROTONIX) 20 MG tablet  Daily        10/18/22 2035    sucralfate (CARAFATE) 1 g tablet  3 times daily with meals & bedtime        10/18/22 2035              Virgina Norfolk, DO 10/18/22 2038

## 2023-07-04 ENCOUNTER — Other Ambulatory Visit: Payer: Self-pay

## 2023-07-04 ENCOUNTER — Emergency Department (HOSPITAL_BASED_OUTPATIENT_CLINIC_OR_DEPARTMENT_OTHER): Payer: Self-pay

## 2023-07-04 ENCOUNTER — Encounter (HOSPITAL_BASED_OUTPATIENT_CLINIC_OR_DEPARTMENT_OTHER): Payer: Self-pay | Admitting: Emergency Medicine

## 2023-07-04 DIAGNOSIS — Z20822 Contact with and (suspected) exposure to covid-19: Secondary | ICD-10-CM | POA: Insufficient documentation

## 2023-07-04 DIAGNOSIS — R1011 Right upper quadrant pain: Secondary | ICD-10-CM | POA: Insufficient documentation

## 2023-07-04 DIAGNOSIS — J101 Influenza due to other identified influenza virus with other respiratory manifestations: Secondary | ICD-10-CM | POA: Insufficient documentation

## 2023-07-04 DIAGNOSIS — R1031 Right lower quadrant pain: Secondary | ICD-10-CM | POA: Insufficient documentation

## 2023-07-04 LAB — CBC
HCT: 40.1 % (ref 36.0–46.0)
Hemoglobin: 13.5 g/dL (ref 12.0–15.0)
MCH: 30.6 pg (ref 26.0–34.0)
MCHC: 33.7 g/dL (ref 30.0–36.0)
MCV: 90.9 fL (ref 80.0–100.0)
Platelets: 225 10*3/uL (ref 150–400)
RBC: 4.41 MIL/uL (ref 3.87–5.11)
RDW: 12.6 % (ref 11.5–15.5)
WBC: 8 10*3/uL (ref 4.0–10.5)
nRBC: 0 % (ref 0.0–0.2)

## 2023-07-04 LAB — BASIC METABOLIC PANEL
Anion gap: 7 (ref 5–15)
BUN: 28 mg/dL — ABNORMAL HIGH (ref 6–20)
CO2: 22 mmol/L (ref 22–32)
Calcium: 8.5 mg/dL — ABNORMAL LOW (ref 8.9–10.3)
Chloride: 107 mmol/L (ref 98–111)
Creatinine, Ser: 1.14 mg/dL — ABNORMAL HIGH (ref 0.44–1.00)
GFR, Estimated: >60 mL/min (ref >60)
Glucose, Bld: 97 mg/dL (ref 70–99)
Potassium: 3.6 mmol/L (ref 3.5–5.1)
Sodium: 136 mmol/L (ref 135–145)

## 2023-07-04 NOTE — ED Triage Notes (Signed)
 Pt presents to the ED via POV with complaints of mid-sternal CP that started 2 weeks ago. Hx of reflux and is not currently taking medication. Rates the pain 10/10 and has some burning in her chest. She last took Tylenol  around 1830 without any relief. Of note, the patient had the flu 2 weeks ago and the pain has been present since that time. A&Ox4 at this time. Denies fevers, chills, N/V, or SOB.

## 2023-07-05 ENCOUNTER — Emergency Department (HOSPITAL_BASED_OUTPATIENT_CLINIC_OR_DEPARTMENT_OTHER): Payer: Self-pay

## 2023-07-05 ENCOUNTER — Encounter (HOSPITAL_BASED_OUTPATIENT_CLINIC_OR_DEPARTMENT_OTHER): Payer: Self-pay

## 2023-07-05 ENCOUNTER — Emergency Department (HOSPITAL_BASED_OUTPATIENT_CLINIC_OR_DEPARTMENT_OTHER)
Admission: EM | Admit: 2023-07-05 | Discharge: 2023-07-05 | Disposition: A | Discharge status: Home / Self Care | Payer: Self-pay | Attending: Emergency Medicine | Admitting: Emergency Medicine

## 2023-07-05 DIAGNOSIS — R1011 Right upper quadrant pain: Secondary | ICD-10-CM

## 2023-07-05 DIAGNOSIS — J189 Pneumonia, unspecified organism: Secondary | ICD-10-CM

## 2023-07-05 DIAGNOSIS — K760 Fatty (change of) liver, not elsewhere classified: Secondary | ICD-10-CM

## 2023-07-05 DIAGNOSIS — J101 Influenza due to other identified influenza virus with other respiratory manifestations: Secondary | ICD-10-CM

## 2023-07-05 HISTORY — DX: Gastro-esophageal reflux disease without esophagitis: K21.9

## 2023-07-05 LAB — URINALYSIS, W/ REFLEX TO CULTURE (INFECTION SUSPECTED)
Bilirubin Urine: NEGATIVE
Glucose, UA: NEGATIVE mg/dL
Ketones, ur: NEGATIVE mg/dL
Leukocytes,Ua: NEGATIVE
Nitrite: NEGATIVE
Protein, ur: NEGATIVE mg/dL
Specific Gravity, Urine: 1.01 (ref 1.005–1.030)
pH: 5.5 (ref 5.0–8.0)

## 2023-07-05 LAB — TROPONIN I (HIGH SENSITIVITY)
Troponin I (High Sensitivity): 2 ng/L (ref <18)
Troponin I (High Sensitivity): 3 ng/L (ref <18)

## 2023-07-05 LAB — HEPATIC FUNCTION PANEL
ALT: 95 U/L — ABNORMAL HIGH (ref 0–44)
AST: 100 U/L — ABNORMAL HIGH (ref 15–41)
Albumin: 3.9 g/dL (ref 3.5–5.0)
Alkaline Phosphatase: 189 U/L — ABNORMAL HIGH (ref 38–126)
Bilirubin, Direct: 0.1 mg/dL (ref 0.0–0.2)
Indirect Bilirubin: 0.3 mg/dL (ref 0.3–0.9)
Total Bilirubin: 0.4 mg/dL (ref 0.0–1.2)
Total Protein: 7.2 g/dL (ref 6.5–8.1)

## 2023-07-05 LAB — RESP PANEL BY RT-PCR (RSV, FLU A&B, COVID)  RVPGX2
Influenza A by PCR: POSITIVE — AB
Influenza B by PCR: NEGATIVE
Resp Syncytial Virus by PCR: NEGATIVE
SARS Coronavirus 2 by RT PCR: NEGATIVE

## 2023-07-05 LAB — D-DIMER, QUANTITATIVE: D-Dimer, Quant: 0.93 ug{FEU}/mL — ABNORMAL HIGH (ref 0.00–0.50)

## 2023-07-05 LAB — LIPASE, BLOOD: Lipase: 42 U/L (ref 11–51)

## 2023-07-05 LAB — HCG, SERUM, QUALITATIVE: Preg, Serum: NEGATIVE

## 2023-07-05 MED ORDER — ACETAMINOPHEN 325 MG PO TABS
650.0000 mg | ORAL_TABLET | Freq: Once | ORAL | Status: AC | PRN
  Administered 2023-07-05: 650 mg via ORAL
  Filled 2023-07-05: qty 2

## 2023-07-05 MED ORDER — ONDANSETRON HCL 4 MG/2ML IJ SOLN
4.0000 mg | Freq: Once | INTRAMUSCULAR | Status: AC
  Administered 2023-07-05: 4 mg via INTRAVENOUS
  Filled 2023-07-05: qty 2

## 2023-07-05 MED ORDER — AMOXICILLIN-POT CLAVULANATE 875-125 MG PO TABS
1.0000 | ORAL_TABLET | Freq: Two times a day (BID) | ORAL | 0 refills | Status: AC
Start: 2023-07-05 — End: 2023-07-12

## 2023-07-05 MED ORDER — ONDANSETRON 4 MG PO TBDP: 4.0000 mg | ORAL_TABLET | Freq: Three times a day (TID) | ORAL | 0 refills | Status: AC | PRN

## 2023-07-05 MED ORDER — SODIUM CHLORIDE 0.9 % IV BOLUS
1000.0000 mL | Freq: Once | INTRAVENOUS | Status: AC
  Administered 2023-07-05: 1000 mL via INTRAVENOUS

## 2023-07-05 MED ORDER — MORPHINE SULFATE (PF) 4 MG/ML IV SOLN
4.0000 mg | Freq: Once | INTRAVENOUS | Status: AC
  Administered 2023-07-05: 4 mg via INTRAVENOUS
  Filled 2023-07-05: qty 1

## 2023-07-05 MED ORDER — ONDANSETRON 4 MG PO TBDP
4.0000 mg | ORAL_TABLET | Freq: Three times a day (TID) | ORAL | 0 refills | Status: DC | PRN
Start: 2023-07-05 — End: 2023-07-05

## 2023-07-05 MED ORDER — DOXYCYCLINE HYCLATE 100 MG PO CAPS: 100.0000 mg | ORAL_CAPSULE | Freq: Two times a day (BID) | ORAL | 0 refills | Status: AC

## 2023-07-05 MED ORDER — AMOXICILLIN-POT CLAVULANATE 875-125 MG PO TABS: 1.0000 | ORAL_TABLET | Freq: Two times a day (BID) | ORAL | 0 refills | Status: DC

## 2023-07-05 MED ORDER — IOHEXOL 350 MG/ML SOLN
100.0000 mL | Freq: Once | INTRAVENOUS | Status: AC | PRN
  Administered 2023-07-05: 100 mL via INTRAVENOUS

## 2023-07-05 MED ORDER — DOXYCYCLINE HYCLATE 100 MG PO CAPS: 100.0000 mg | ORAL_CAPSULE | Freq: Two times a day (BID) | ORAL | 0 refills | Status: DC

## 2023-07-05 NOTE — ED Provider Notes (Signed)
 Past Medical History:  Diagnosis Date   GERD (gastroesophageal reflux disease)    OSA (obstructive sleep apnea)     Physical Exam  BP 117/72   Pulse 80   Temp 98.3 F (36.8 C) (Oral)   Resp 16   Ht 5' 1 (1.549 m)   Wt 77.1 kg   SpO2 96%   BMI 32.12 kg/m   Physical Exam  Procedures  Procedures  ED Course / MDM     48yo female presents with concern for right upper quadrant abdominal pain, also noted cough.  Influenza positive. DDimer positive.  CTA PE and CT abdomen pelvis pending.  CT with signs of pneumonia, no other acute abnormalities.  Has RUQ tenderness and RUQ US  ordered to evaluate for signs of cholelithiasis or cholecystitis shows findings of possible hepatic steatosis without other acute abnormalities.   Suspect symptoms related to influenza and pneumonia. Out of tamiflu window.  Will treat pneumonia with antibiotics. Given rx for zofran . Patient discharged in stable condition with understanding of reasons to return.       Dreama Longs, MD 07/06/23 [phone]

## 2023-07-05 NOTE — ED Notes (Signed)
 Lab is aware that Hepatic function panel and lipase needed to be added

## 2023-07-05 NOTE — ED Provider Notes (Signed)
 [facility] Provider Note   CSN: [id number] Arrival date & time: 07/04/23  2218     History  Chief Complaint  Patient presents with   Chest Pain    Kathryn Sanders is a 48 y.o. female.  The history is provided by the patient. A language interpreter was used.  Chest Pain Kathryn Sanders is a 48 y.o. female who presents to the Emergency Department complaining of chest pain.  She presents to the emergency department for multiple complaints but primarily chest and abdominal pain.  Abdominal pain is right upper quadrant radiates to her back with associated vomiting for 2 weeks.  She also has 2 weeks of subjective fever.  She has central chest pain with breathing as well as coughing when she vomits as well as diarrhea.  She also reports occasional leg swelling.   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     Home Medications Prior to Admission medications   Medication Sig Start Date End Date Taking? Authorizing Provider  Anselm Oil (OMEGA-3) 500 MG CAPS Take 1 capsule by mouth daily at 12 noon.    [provider]  ondansetron  (ZOFRAN -ODT) 4 MG disintegrating tablet Take 1 tablet (4 mg total) by mouth every 8 (eight) hours as needed for nausea or vomiting. 10/18/22   Curatolo, Adam, DO  pantoprazole  (PROTONIX ) 20 MG tablet Take 1 tablet (20 mg total) by mouth daily. 10/18/22 11/17/22  Curatolo, Adam, DO  sucralfate  (CARAFATE ) 1 g tablet Take 1 tablet (1 g total) by mouth 4 (four) times daily -  with meals and at bedtime for 14 days. 10/18/22 11/01/22  Curatolo, Adam, DO  vitamin B-12 (CYANOCOBALAMIN) 50 MCG tablet Take 50 mcg by mouth daily.    [provider]  VITAMIN E PO Take by mouth daily.    [provider]      Allergies    Patient has no known allergies.    Review of Systems   Review of Systems  Cardiovascular:  Positive for chest pain.  All other systems reviewed and are  negative.   Physical Exam Updated Vital Signs BP 117/72   Pulse 80   Temp 98.3 F (36.8 C) (Oral)   Resp 16   Ht 5' 1 (1.549 m)   Wt 77.1 kg   SpO2 96%   BMI 32.12 kg/m  Physical Exam Vitals and nursing note reviewed.  Constitutional:      Appearance: She is well-developed.  HENT:     Head: Normocephalic and atraumatic.  Cardiovascular:     Rate and Rhythm: Regular rhythm. Tachycardia present.     Heart sounds: Murmur heard.  Pulmonary:     Effort: Pulmonary effort is normal. No respiratory distress.     Breath sounds: Normal breath sounds.  Abdominal:     Palpations: Abdomen is soft.     Tenderness: There is no guarding or rebound.     Comments: Moderate RUQ, mild RLQ tenderness  Musculoskeletal:        General: No tenderness.  Skin:    General: Skin is warm and dry.  Neurological:     Mental Status: She is alert and oriented to person, place, and time.  Psychiatric:        Behavior: Behavior normal.     ED Results / Procedures / Treatments   Labs (all labs ordered are listed, but only abnormal results are displayed) Labs Reviewed  RESP PANEL BY RT-PCR (RSV,  FLU A&B, COVID)  RVPGX2 - Abnormal; Notable for the following components:      Result Value   Influenza A by PCR POSITIVE (*)    All other components within normal limits  BASIC METABOLIC PANEL - Abnormal; Notable for the following components:   BUN 28 (*)    Creatinine, Ser 1.14 (*)    Calcium 8.5 (*)    All other components within normal limits  HEPATIC FUNCTION PANEL - Abnormal; Notable for the following components:   AST 100 (*)    ALT 95 (*)    Alkaline Phosphatase 189 (*)    All other components within normal limits  URINALYSIS, W/ REFLEX TO CULTURE (INFECTION SUSPECTED) - Abnormal; Notable for the following components:   Hgb urine dipstick TRACE (*)    Bacteria, UA RARE (*)    All other components within normal limits  D-DIMER, QUANTITATIVE (NOT AT [facility]) - Abnormal; Notable for the  following components:   D-Dimer, Quant 0.93 (*)    All other components within normal limits  CBC  LIPASE, BLOOD  HCG, SERUM, QUALITATIVE  TROPONIN I (HIGH SENSITIVITY)  TROPONIN I (HIGH SENSITIVITY)    EKG EKG Interpretation Date/Time:  Friday July 04 2023 22:32:22 EST Ventricular Rate:  89 PR Interval:  145 QRS Duration:  82 QT Interval:  343 QTC Calculation: 418 R Axis:   12  Text Interpretation: Sinus rhythm Confirmed by Griselda Norris [phone]) on 07/05/2023 2:00:40 AM  Radiology DG Chest 2 View Result Date: 07/04/2023 CLINICAL DATA:  Chest pain EXAM: CHEST - 2 VIEW COMPARISON:  04/05/2020 FINDINGS: The heart size and mediastinal contours are within normal limits. Both lungs are clear. The visualized skeletal structures are unremarkable. IMPRESSION: No active cardiopulmonary disease. Electronically Signed   By: Franky Crease M.D.   On: 07/04/2023 23:19    Procedures Procedures    Medications Ordered in ED Medications  acetaminophen  (TYLENOL ) tablet 650 mg (650 mg Oral Given 07/05/23 0309)  ondansetron  (ZOFRAN ) injection 4 mg (4 mg Intravenous Given 07/05/23 0412)  morphine  (PF) 4 MG/ML injection 4 mg (4 mg Intravenous Given 07/05/23 0412)  sodium chloride  0.9 % bolus 1,000 mL (0 mLs Intravenous Stopped 07/05/23 0543)  morphine  (PF) 4 MG/ML injection 4 mg (4 mg Intravenous Given 07/05/23 0603)  iohexol  (OMNIPAQUE ) 350 MG/ML injection 100 mL (100 mLs Intravenous Contrast Given 07/05/23 9357)    ED Course/ Medical Decision Making/ A&P                                 Medical Decision Making Amount and/or Complexity of Data Reviewed Labs: ordered. Radiology: ordered.  Risk OTC drugs. Prescription drug management.   Patient here for evaluation of chest pain, abdominal pain.  Found to be febrile, tachycardic on ED evaluation.  She does have focal right upper quadrant tenderness.  She is positive for flu.  D-dimer was obtained, which was mildly elevated.  Plan to obtain CT  abdomen pelvis as well as CT PE study to further evaluate her symptoms.  She was treated with pain medications, IV fluids pending further evaluation.  Patient care transferred pending scans.        Final Clinical Impression(s) / ED Diagnoses Final diagnoses:  None    Rx / DC Orders ED Discharge Orders     None         Griselda Norris, MD 07/05/23 [phone]
